# Patient Record
Sex: Female | Born: 1985
Health system: Southern US, Community
[De-identification: ages and names within clinical notes are randomized; demographics above are authoritative.]

## PROBLEM LIST (undated history)

## (undated) ENCOUNTER — Encounter

## (undated) ENCOUNTER — Encounter: Attending: Family | Primary: Family

## (undated) ENCOUNTER — Ambulatory Visit

## (undated) ENCOUNTER — Ambulatory Visit: Payer: PRIVATE HEALTH INSURANCE

## (undated) ENCOUNTER — Ambulatory Visit
Attending: Student in an Organized Health Care Education/Training Program | Primary: Student in an Organized Health Care Education/Training Program

## (undated) ENCOUNTER — Ambulatory Visit: Payer: PRIVATE HEALTH INSURANCE | Attending: Family | Primary: Family

## (undated) ENCOUNTER — Encounter
Attending: Student in an Organized Health Care Education/Training Program | Primary: Student in an Organized Health Care Education/Training Program

## (undated) ENCOUNTER — Ambulatory Visit: Payer: BLUE CROSS/BLUE SHIELD | Attending: Family | Primary: Family

## (undated) ENCOUNTER — Ambulatory Visit
Payer: BLUE CROSS/BLUE SHIELD | Attending: Student in an Organized Health Care Education/Training Program | Primary: Student in an Organized Health Care Education/Training Program

## (undated) ENCOUNTER — Ambulatory Visit: Payer: BLUE CROSS/BLUE SHIELD

## (undated) ENCOUNTER — Ambulatory Visit
Payer: PRIVATE HEALTH INSURANCE | Attending: Student in an Organized Health Care Education/Training Program | Primary: Student in an Organized Health Care Education/Training Program

## (undated) ENCOUNTER — Inpatient Hospital Stay: Payer: BLUE CROSS/BLUE SHIELD

## (undated) ENCOUNTER — Telehealth

## (undated) ENCOUNTER — Telehealth: Attending: Family | Primary: Family

## (undated) DIAGNOSIS — F988 Other specified behavioral and emotional disorders with onset usually occurring in childhood and adolescence: Secondary | ICD-10-CM

## (undated) DIAGNOSIS — E041 Nontoxic single thyroid nodule: Secondary | ICD-10-CM

## (undated) DIAGNOSIS — G5600 Carpal tunnel syndrome, unspecified upper limb: Secondary | ICD-10-CM

## (undated) DIAGNOSIS — Z9289 Personal history of other medical treatment: Secondary | ICD-10-CM

## (undated) HISTORY — PX: SINOSCOPY: SHX187

## (undated) HISTORY — PX: TONSILLECTOMY: SUR1361

## (undated) HISTORY — DX: Personal history of other medical treatment: Z92.89

## (undated) HISTORY — DX: Other specified behavioral and emotional disorders with onset usually occurring in childhood and adolescence: F98.8

## (undated) HISTORY — PX: DEEP NECK LYMPH NODE BIOPSY / EXCISION: SUR126

## (undated) HISTORY — DX: Carpal tunnel syndrome, unspecified upper limb: G56.00

## (undated) HISTORY — DX: Nontoxic single thyroid nodule: E04.1

## (undated) HISTORY — PX: ADENOIDECTOMY: SUR15

## (undated) HISTORY — PX: TONSILLECTOMY: SHX5217

---
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
## 2008-03-06 DIAGNOSIS — G5600 Carpal tunnel syndrome, unspecified upper limb: Secondary | ICD-10-CM

## 2008-03-06 HISTORY — DX: Carpal tunnel syndrome, unspecified upper limb: G56.00

## 2008-03-06 HISTORY — PX: CARPAL TUNNEL RELEASE: SHX101

## 2009-10-04 DIAGNOSIS — Z9289 Personal history of other medical treatment: Secondary | ICD-10-CM

## 2009-10-04 HISTORY — DX: Personal history of other medical treatment: Z92.89

## 2009-11-10 ENCOUNTER — Inpatient Hospital Stay (HOSPITAL_COMMUNITY)
Admission: AD | Admit: 2009-11-10 | Discharge: 2009-11-10 | Payer: Self-pay | Source: Home / Self Care | Admitting: Obstetrics and Gynecology

## 2009-11-10 ENCOUNTER — Ambulatory Visit: Payer: Self-pay | Admitting: Obstetrics and Gynecology

## 2009-11-10 DIAGNOSIS — O9989 Other specified diseases and conditions complicating pregnancy, childbirth and the puerperium: Secondary | ICD-10-CM

## 2009-11-10 DIAGNOSIS — O99891 Other specified diseases and conditions complicating pregnancy: Secondary | ICD-10-CM

## 2009-11-10 DIAGNOSIS — N949 Unspecified condition associated with female genital organs and menstrual cycle: Secondary | ICD-10-CM

## 2010-02-17 ENCOUNTER — Observation Stay (HOSPITAL_COMMUNITY)
Admission: AD | Admit: 2010-02-17 | Discharge: 2010-02-18 | Payer: Self-pay | Source: Home / Self Care | Attending: Obstetrics and Gynecology | Admitting: Obstetrics and Gynecology

## 2010-03-06 HISTORY — PX: TUBAL LIGATION: SHX77

## 2010-04-01 NOTE — Discharge Summary (Signed)
  NAMEMarland Kitchen  Regina Bradley, Regina Bradley              ACCOUNT NO.:  [id number]  MEDICAL RECORD NO.:  [id number]          PATIENT TYPE:  OBV  LOCATION:  9307                          FACILITY:  [facility]  PHYSICIAN:  Marlinda Mike, C.N.M.   DATE OF BIRTH:  [birth year]  DATE OF ADMISSION:  02/17/2010 DATE OF DISCHARGE:  02/18/2010                              DISCHARGE SUMMARY   DIAGNOSES:  Gestation  25 weeks, viral gastroenteritis, and dehydration.  DISCHARGE DIAGNOSIS:  Gestation 25 weeks, resolving viral gastroenteritis, and stable condition.  The patient is a 25 year old a obstetrical patient at 108 weeks' gestation with onset of nausea, vomiting, diarrhea over the course of 12 hours prior to admission.  The patient unable to tolerate p.o. intake. Urine on admission showed specific gravity of greater than 1.030 and 15 of ketones.  ALLERGIES:  The patient has a known allergy to CECLOR, ZITHROMAX, and CONTRAST DYE.  MEDICATIONS:  Current medications include a prenatal vitamin 1 tablet daily and Zofran 8 mg instant dissolve b.i.d. as needed for nausea and vomiting.  The patient is admitted for IV hydration and supportive management. Zofran 8 mg p.o. b.i.d. and 4 liters of IV fluid.  The patient is discharged home on day of discharge in stable condition.  Physical exam within normal limits.  Fetal heart tones 140s to 150s.  The patient is able to tolerate p.o. intake and the patient is afebrile at time of discharge.   DISCHARGE INSTRUCTIONS:  The patient is discharged to home in stable condition to advance diet as tolerated and to avoid dairy products x3-4 days.  Activity ad lib.  Return to [facility] for next return to [facility] visit as scheduled.  Medications at the time of discharge included prenatal vitamin 1 tablet daily and Zofran 8 mg p.o. b.i.d. as needed for nausea and vomiting.     Marlinda Mike, C.N.M.     TB/MEDQ  D:  03/17/2010  T:  03/18/2010  Job:  [id number]  Electronically  Signed by Marlinda Mike C.N.M. on 03/21/2010 10:33:20 PM Electronically Signed by Olivia Mackie M.D. on 04/01/2010 02:22:56 PM

## 2010-04-15 ENCOUNTER — Other Ambulatory Visit: Payer: Self-pay | Admitting: Obstetrics

## 2010-04-15 ENCOUNTER — Inpatient Hospital Stay (HOSPITAL_COMMUNITY)
Admission: AD | Admit: 2010-04-15 | Discharge: 2010-04-17 | DRG: 379 | Disposition: A | Discharge status: Home / Self Care | Payer: BC Managed Care – PPO | Source: Ambulatory Visit | Attending: Obstetrics | Admitting: Obstetrics

## 2010-04-15 DIAGNOSIS — O4700 False labor before 37 completed weeks of gestation, unspecified trimester: Principal | ICD-10-CM | POA: Diagnosis present

## 2010-04-16 ENCOUNTER — Other Ambulatory Visit: Payer: Self-pay | Admitting: Obstetrics

## 2010-04-16 ENCOUNTER — Inpatient Hospital Stay (HOSPITAL_COMMUNITY): Payer: BC Managed Care – PPO

## 2010-05-05 LAB — URINE CULTURE: Culture: NO GROWTH

## 2010-05-05 LAB — TYPE AND SCREEN
ABO/RH(D): B NEG
Antibody Screen: NEGATIVE

## 2010-05-05 LAB — WET PREP, GENITAL

## 2010-05-05 LAB — URINALYSIS, ROUTINE W REFLEX MICROSCOPIC
Bilirubin Urine: NEGATIVE
Nitrite: NEGATIVE
Specific Gravity, Urine: >1.03 — ABNORMAL HIGH (ref 1.005–1.030)
pH: 6 (ref 5.0–8.0)

## 2010-05-05 LAB — CBC
Hemoglobin: 11.4 g/dL — ABNORMAL LOW (ref 12.0–15.0)
Platelets: 182 10*3/uL (ref 150–400)
RBC: 3.7 MIL/uL — ABNORMAL LOW (ref 3.87–5.11)
WBC: 11 10*3/uL — ABNORMAL HIGH (ref 4.0–10.5)

## 2010-05-05 LAB — STREP B DNA PROBE

## 2010-05-05 LAB — FETAL FIBRONECTIN: Fetal Fibronectin: POSITIVE — AB

## 2010-05-09 ENCOUNTER — Inpatient Hospital Stay (HOSPITAL_COMMUNITY)
Admission: AD | Admit: 2010-05-09 | Discharge: 2010-05-09 | Disposition: A | Discharge status: Home / Self Care | Payer: BC Managed Care – PPO | Source: Ambulatory Visit | Attending: Obstetrics & Gynecology | Admitting: Obstetrics & Gynecology

## 2010-05-09 DIAGNOSIS — O4700 False labor before 37 completed weeks of gestation, unspecified trimester: Secondary | ICD-10-CM | POA: Insufficient documentation

## 2010-05-13 NOTE — Discharge Summary (Signed)
  Regina Bradley, Regina Bradley              ACCOUNT NO.:  [id number]  MEDICAL RECORD NO.:  [id number]           PATIENT TYPE:  I  LOCATION:  9154                          [facility]:  [facility]  PHYSICIAN:  Lendon Colonel, MD   DATE OF BIRTH:  1985-05-03  DATE OF ADMISSION:  04/15/2010 DATE OF DISCHARGE:  04/17/2010                              DISCHARGE SUMMARY   CHIEF COMPLAINT:  Contractions, question leaking of fluid.  HISTORY OF PRESENT ILLNESS:  This is a 25 year old G2, P1 at 33+ weeks who presented with complaints of occasional contractions and question leaking of fluid.  She had good fetal movement, no vaginal bleeding, and mild contractions.  The patient's pregnancy history is significant for early care at [facility] with good dating and multiple complaints of contractions, back pain, and joint pain throughout the pregnancy.  The remainder of past the medical, surgical, and obstetrical history is as per her admission H and P.  PHYSICAL EXAMINATION:  GENERAL:  On exam, she was afebrile with stable vitals. CARDIOVASCULAR:  Regular rate and rhythm. PULMONARY:  Normal. ABDOMEN:  Soft and nontender.  No right upper quadrant tenderness. GU:  She was fern, pool and  Nitrazine negative.  Her cervix was 1 cm, with a fetal vertex high in the vagina.  She had reactive fetal testing with a heart beat in 140s and was noted to be contracting q.3 minutes on the tocometer.  The patient had a fetal fibronectin that returned positive. Of note, she had intercourse about 24 hours prior.  HOSPITAL COURSE:  The patient was admitted with a positive fetal fibronectin, slight cervical change over a 2-hour period and contracting q.3 minutes.  She was given IV fluids.  She was started on penicillin for unknown group B strep status, and she was placed on labor unit where she received betamethasone and nifedipine for tocolysis.  By the a.m. of the next morning, the patient was still having contractions that had  slowed to q.10 minutes.  Repeat cervical exam was the same as her second exam in the ER which was 1 cm dilated, about 20% effaced with a soft cervix high, and fetal part high in the pelvis.  The patient was then transferred to the antepartum floor for 3 times a day monitoring. Penicillin was discontinued.  Strict I's and O's were discontinued, and the patient received her second betamethasone shot that happened 24 hours after her last and by hospital day #3, the patient was having only very rare contractions, still no cervical change, the remainder of her NST was reactive.  The patient was discharged to home with diagnosis of arrested preterm labor for bed rest at home and nifedipine tocolysis with a plan for repeat pelvic exam and fetal fibronectin in the office in 3-4 days and further management pending that test results.    Lendon Colonel, MD    KAF/MEDQ  D:  04/17/2010  T:  04/18/2010  Job:  [id number]  Electronically Signed by Noland Fordyce MD on 05/12/2010 07:09:15 PM

## 2010-05-16 LAB — URINALYSIS, ROUTINE W REFLEX MICROSCOPIC
Bilirubin Urine: NEGATIVE
Glucose, UA: NEGATIVE mg/dL
Ketones, ur: 15 mg/dL — AB
Specific Gravity, Urine: >1.03 — ABNORMAL HIGH (ref 1.005–1.030)
pH: 6 (ref 5.0–8.0)

## 2010-05-19 LAB — URINE CULTURE: Culture: NO GROWTH

## 2010-05-19 LAB — GC/CHLAMYDIA PROBE AMP, GENITAL: Chlamydia, DNA Probe: NEGATIVE

## 2010-05-19 LAB — URINALYSIS, ROUTINE W REFLEX MICROSCOPIC
Glucose, UA: NEGATIVE mg/dL
Hgb urine dipstick: NEGATIVE
Protein, ur: NEGATIVE mg/dL
Specific Gravity, Urine: 1.02 (ref 1.005–1.030)
Urobilinogen, UA: 0.2 mg/dL (ref 0.0–1.0)

## 2010-05-19 LAB — WET PREP, GENITAL
Clue Cells Wet Prep HPF POC: NONE SEEN
Trich, Wet Prep: NONE SEEN
Yeast Wet Prep HPF POC: NONE SEEN

## 2010-05-22 ENCOUNTER — Inpatient Hospital Stay (HOSPITAL_COMMUNITY)
Admission: AD | Admit: 2010-05-22 | Discharge: 2010-05-25 | DRG: 371 | Disposition: A | Discharge status: Home / Self Care | Payer: BC Managed Care – PPO | Source: Ambulatory Visit | Attending: Obstetrics and Gynecology | Admitting: Obstetrics and Gynecology

## 2010-05-22 ENCOUNTER — Other Ambulatory Visit: Payer: Self-pay | Admitting: Obstetrics & Gynecology

## 2010-05-22 DIAGNOSIS — O3660X0 Maternal care for excessive fetal growth, unspecified trimester, not applicable or unspecified: Principal | ICD-10-CM | POA: Diagnosis present

## 2010-05-22 DIAGNOSIS — O409XX0 Polyhydramnios, unspecified trimester, not applicable or unspecified: Secondary | ICD-10-CM | POA: Diagnosis present

## 2010-05-22 DIAGNOSIS — Z302 Encounter for sterilization: Secondary | ICD-10-CM

## 2010-05-22 LAB — RPR: RPR Ser Ql: NONREACTIVE

## 2010-05-22 LAB — CBC
HCT: 37.9 % (ref 36.0–46.0)
MCHC: 34 g/dL (ref 30.0–36.0)
Platelets: 170 10*3/uL (ref 150–400)
RDW: 13.5 % (ref 11.5–15.5)
WBC: 9.2 10*3/uL (ref 4.0–10.5)

## 2010-05-23 LAB — CBC
MCV: 90.7 fL (ref 78.0–100.0)
Platelets: 180 10*3/uL (ref 150–400)
RBC: 3.88 MIL/uL (ref 3.87–5.11)
RDW: 13.6 % (ref 11.5–15.5)
WBC: 11.1 10*3/uL — ABNORMAL HIGH (ref 4.0–10.5)

## 2010-05-25 ENCOUNTER — Encounter (HOSPITAL_COMMUNITY)
Admission: RE | Admit: 2010-05-25 | Discharge: 2010-05-25 | Disposition: A | Discharge status: Home / Self Care | Payer: BC Managed Care – PPO | Source: Ambulatory Visit | Attending: Obstetrics & Gynecology | Admitting: Obstetrics & Gynecology

## 2010-05-25 DIAGNOSIS — O923 Agalactia: Secondary | ICD-10-CM | POA: Insufficient documentation

## 2010-05-26 NOTE — Op Note (Signed)
Regina Bradley, Regina Bradley              ACCOUNT NO.:  [id number]  MEDICAL RECORD NO.:  [id number]           PATIENT TYPE:  I  LOCATION:  9103                          [facility]ILITY:  [facility]  PHYSICIAN:  Darryl Nestle, MD     DATE OF BIRTH:  [birth year]  DATE OF PROCEDURE:  05/22/2010 DATE OF DISCHARGE:                              OPERATIVE REPORT   PREOPERATIVE DIAGNOSES:  38 weeks and 3 days active labor, suspected macrosomia.  The patient desired elective cesarean section, polyhydramnios.  Permanent sterilization desired.  POSTOPERATIVE DIAGNOSES:  38 weeks and 3 days active labor, suspected macrosomia.  The patient desired elective cesarean section, polyhydramnios.  PROCEDURE:  Primary low transverse cesarean section and permanent sterilization with tubal ligation.  SURGEON:  Darryl Nestle, MD  ASSISTANT:  Lenoard Aden, MD  ANESTHESIA:  Spinal.  FINDINGS:  Female infant, floating head, large amount of amniotic fluid. Baby delivery with vacuum one pull at 11:55 a.m. Apgars 9 and 9 at 1 and 5 minutes and weight 9 pounds 4 ounces.  Placenta normal, three-vessel cord.  Bilateral tubes and ovaries normal.  SPECIMEN:  Bilateral cut segments of fallopian tubes sent to Pathology.  ESTIMATED BLOOD LOSS:  800 mL.  IV FLUIDS:  3200 mL LR.  URINE OUTPUT:  200 mL clear.  COMPLICATIONS:  None.  CONDITION:  Stable.  DISPOSITION:  To PACU.  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  The patient was scheduled for induction at 39 weeks for these reasons; however, she called back the office and requested a primary cesarean delivery instead of induction of labor.  Risks and complications of surgery including the complications at surgery of infection, bleeding, damage to  internal organs as well as VTE, delayed complications including long term problems having had a surgery were discussed with the patient, and she was voiced understanding but still desired to proceed with primary elective cesarean delivery.  She also wanted permanent sterilization, CREST data was reviewed with her with risk of regret and also discussed risk of failure with the risk of ectopic pregnancy, and the patient voiced understanding.  The patient presented today in active labor.  She was 3-4 cm dilated with bulging bag of water and the fetal head was at the pelvic brim but ballotable, decision was made to proceed with C- section today.  Risks and complications were reviewed, informed written consent was obtained.  The patient was brought to the operating room with IV running.  She received gentamicin and clindamycin, appropriate perioperative antibiotic coverage due to CECLOR allergy.  The spinal anesthesia was noted to be adequate.  She was in dorsal supine position. Foley catheter was placed.  Parts were prepped and draped in a normal sterile fashion.  Anesthesia was adequate.  Pfannenstiel incision was made with scalpel and carried down to the underlying fascia which was incised in the midline and extended laterally curving upward with scissors and Bovie.  The underlying muscles were dissected off by blunt and sharp dissection and muscles were separated followed by grasping of the peritoneum, sharp entry was made away from the bladder and incision was extended superiorly and inferiorly.  Bladder blade was placed, lower segment of the uterus was distended.  There was vascularity on the lower segment which were clamped and cauterized.  The baby was noted to be floating in the lower segment.  A bladder flap was created and bladder blade was advanced.  A low transverse hysterotomy was performed with scalpel and carried down and the incision was extended laterally curving upward with  bandage scissors.  At this point, bulging amniotic membranes were ruptured with Allis clamps and large amount of amniotic fluid was drained.  Fundal pressure was maintained to keep the head from moving out of the lower segment, however, it stayed floating and was brought to the incision, but the delivery was difficult due to deflected head and was completed with vacuum pull at the incision with one gentle traction. The shoulders were delivered followed by rest of the baby.  Cord was clamped and cut and baby was given to the awaiting neonatologist.  The bleeders in the lower segment were clamped with the ring forceps. Placenta was then removed by gentle massage and gentle traction and placenta was noted to be complete and intact and three vessels were noted in the cord.  Placenta was sent to Labor and Delivery.  The uterus was cleaned from the inside with a clean lap.  Hemostasis was excellent with the help of ring forceps grasping the edges of the hysterotomy. Uterine tone was improved with Pitocin and hysterotomy was closed in single layer, continuous interlocking of Monocryl.  Bilateral tubes were identified, ovaries appeared normal.  Ampullary portion of tube was grasped with Tanja Port and a modified Pomeroy tubal ligation was done and the cut segment of the tube was sent to pathology from both the sites. Hemostasis and the tubal stumps was excellent and tubes were returned to the abdomen.  The hysterotomy was inspected again, blood clots that were collected were removed.  Hemostasis was excellent.  The parietal peritoneum was now closed using 2-0 Vicryl.  Fascia was closed using 0 Vicryl starting from 2 angles meeting in midline.  The subcutaneous tissue was approximated using 2-0 plain gut and skin was approximated using staples.  Sterile dressing was given.  The patient was stable and brought to the recovery room.  Please note the patient also had SCDs placed during surgery.  No  complications.  All counts were correct x2.     Darryl Nestle, MD     VM/MEDQ  D:  05/22/2010  T:  05/23/2010  Job:  [id number]  Electronically Signed by Susa Day Shelah Heatley  on 05/26/2010 07:48:09 AM

## 2010-05-31 ENCOUNTER — Inpatient Hospital Stay (HOSPITAL_COMMUNITY)
Admission: AD | Admit: 2010-05-31 | Discharge: 2010-05-31 | Disposition: A | Discharge status: Home / Self Care | Payer: BC Managed Care – PPO | Source: Ambulatory Visit | Attending: Obstetrics | Admitting: Obstetrics

## 2010-05-31 DIAGNOSIS — O909 Complication of the puerperium, unspecified: Secondary | ICD-10-CM | POA: Insufficient documentation

## 2010-05-31 DIAGNOSIS — L02219 Cutaneous abscess of trunk, unspecified: Secondary | ICD-10-CM | POA: Insufficient documentation

## 2010-05-31 DIAGNOSIS — L03319 Cellulitis of trunk, unspecified: Secondary | ICD-10-CM | POA: Insufficient documentation

## 2010-06-25 ENCOUNTER — Encounter (HOSPITAL_COMMUNITY)
Admission: RE | Admit: 2010-06-25 | Discharge: 2010-06-25 | Disposition: A | Discharge status: Home / Self Care | Payer: BC Managed Care – PPO | Source: Ambulatory Visit | Attending: Obstetrics & Gynecology | Admitting: Obstetrics & Gynecology

## 2010-06-25 DIAGNOSIS — O923 Agalactia: Secondary | ICD-10-CM | POA: Insufficient documentation

## 2010-07-05 NOTE — Discharge Summary (Signed)
  Regina Bradley, Regina Bradley              ACCOUNT NO.:  [id number]  MEDICAL RECORD NO.:  [id number]           PATIENT TYPE:  I  LOCATION:  9103                          [facility]:  [facility]  PHYSICIAN:  Darryl Nestle, MD     DATE OF BIRTH:  1986-01-02  DATE OF ADMISSION:  05/22/2010 DATE OF DISCHARGE:  05/25/2010                              DISCHARGE SUMMARY   DIAGNOSES:  25 weeks' gestation, onset of labor large for gestational age, suspect macrosomia, desires elective cesarean section.  DISCHARGE DIAGNOSIS:  Postoperative day #3 status cesarean section and bilateral tubal sterilization.  PATIENT HISTORY:  The patient is a 25 year old gravida 2, para 1 at 38 weeks and 5 days with an [facility] of March 27.  Prenatal care at [facility] since 9 weeks' gestation with Dr. Juliene Pina as primary.  PRENATAL LABS:  Type and Rh B positive, rubella positive, HIV negative, RPR negative, Hepatitis B negative, GTT within normal limits, and a beta strep positive.  PRENATAL COURSE:  Complicated by history of large for gestational age, previous spontaneous vaginal delivery of 9 pounds 1 ounce with third- degree laceration.  MEDICAL AND SURGICAL HISTORY:  Significant for Sullivan Lone syndrome, tonsillectomy and carpal tunnel release on the right wrist.  ALLERGIES:  ZITHROMAX, PHENERGAN, and CECLOR.  CURRENT MEDICATION:  Prenatal vitamin 1 tablet p.o. daily.  PRESENTATION:  The patient presents with the onset of labor and latent labor 3-4 cm dilated, reactive NST.  The patient has been counseled related to large for gestational age with estimated fetal weight greater than 10 pounds per ultrasound.  The patient elects for cesarean section and refuses labor.  Admission CBC; white blood cell count 9.2, hemoglobin 12.9, hematocrit 36.9, and platelet count of 170.  PROCEDURE:  The patient underwent cesarean section and bilateral tubal sterilization by Dr. Juliene Pina on March 18 with delivery of a female, 9 pounds 4 ounces  with Apgar scores of 9 and 9, newborn was transferred to regular nursery.  POSTOPERATIVE COURSE:  The patient's postoperative postpartum course was uneventful.  White blood cell count of 11.9, hemoglobin 11.7, hematocrit 35.2, and platelet count of 180.  Vital signs were stable.  The patient remained afebrile during hospitalization.  Physical exam was within normal limits.  Wound edges are well approximated with staples.  No erythema, no ecchymosis, no drainage.  The patient is discharged home on postop day #3.  DISCHARGE INSTRUCTIONS:  Postpartum care per [facility] book.  Regular diet. Activity:  Postoperative restrictions x2 weeks.  Incision:  Staple removal on postpartum day 4 to 5 secondary to obesity.  MEDICATIONS: 1. Prenatal vitamin 1 tablet p.o. daily. 2. Ibuprofen 800 mg every 8 hours as needed for discomfort p.r.n. 3. Percocet 1-2 tablets every 4 hours as needed for pain p.r.n.     Marlinda Mike, C.N.M.   ______________________________ Darryl Nestle, MD    TB/MEDQ  D:  05/25/2010  T:  05/26/2010  Job:  [id number]  Electronically Signed by Marlinda Mike C.N.M. on 05/26/2010 04:26:29 PM Electronically Signed by Susa Day Ayodele Hartsock  on 07/05/2010 03:18:30 PM

## 2011-03-20 ENCOUNTER — Ambulatory Visit
Admission: RE | Admit: 2011-03-20 | Discharge: 2011-03-20 | Disposition: A | Discharge status: Home / Self Care | Payer: BC Managed Care – PPO | Source: Ambulatory Visit | Attending: Obstetrics & Gynecology | Admitting: Obstetrics & Gynecology

## 2011-03-20 ENCOUNTER — Other Ambulatory Visit: Payer: Self-pay | Admitting: Obstetrics & Gynecology

## 2011-03-20 DIAGNOSIS — N6320 Unspecified lump in the left breast, unspecified quadrant: Secondary | ICD-10-CM

## 2011-03-21 ENCOUNTER — Other Ambulatory Visit: Payer: BC Managed Care – PPO

## 2011-03-27 ENCOUNTER — Other Ambulatory Visit: Payer: BC Managed Care – PPO

## 2012-01-16 ENCOUNTER — Other Ambulatory Visit: Payer: Self-pay | Admitting: Obstetrics & Gynecology

## 2012-01-16 DIAGNOSIS — E049 Nontoxic goiter, unspecified: Secondary | ICD-10-CM

## 2012-01-22 ENCOUNTER — Ambulatory Visit
Admission: RE | Admit: 2012-01-22 | Discharge: 2012-01-22 | Disposition: A | Discharge status: Home / Self Care | Payer: BC Managed Care – PPO | Source: Ambulatory Visit | Attending: Obstetrics & Gynecology | Admitting: Obstetrics & Gynecology

## 2012-01-22 DIAGNOSIS — E049 Nontoxic goiter, unspecified: Secondary | ICD-10-CM

## 2012-02-15 ENCOUNTER — Other Ambulatory Visit: Payer: Self-pay | Admitting: Gastroenterology

## 2012-02-19 ENCOUNTER — Ambulatory Visit
Admission: RE | Admit: 2012-02-19 | Discharge: 2012-02-19 | Disposition: A | Discharge status: Home / Self Care | Payer: BC Managed Care – PPO | Source: Ambulatory Visit | Attending: Gastroenterology | Admitting: Gastroenterology

## 2012-10-22 ENCOUNTER — Other Ambulatory Visit: Payer: Self-pay | Admitting: Gastroenterology

## 2012-10-22 DIAGNOSIS — R1011 Right upper quadrant pain: Secondary | ICD-10-CM

## 2012-10-22 DIAGNOSIS — R110 Nausea: Secondary | ICD-10-CM

## 2012-11-11 ENCOUNTER — Encounter (HOSPITAL_COMMUNITY): Payer: BC Managed Care – PPO

## 2012-11-11 ENCOUNTER — Ambulatory Visit (HOSPITAL_COMMUNITY): Payer: BC Managed Care – PPO

## 2012-11-25 ENCOUNTER — Encounter (HOSPITAL_COMMUNITY): Admission: RE | Admit: 2012-11-25 | Payer: BC Managed Care – PPO | Source: Ambulatory Visit

## 2012-11-25 ENCOUNTER — Ambulatory Visit (HOSPITAL_COMMUNITY)
Admission: RE | Admit: 2012-11-25 | Discharge: 2012-11-25 | Disposition: A | Discharge status: Home / Self Care | Payer: BC Managed Care – PPO | Source: Ambulatory Visit | Attending: Gastroenterology | Admitting: Gastroenterology

## 2012-11-25 DIAGNOSIS — D1803 Hemangioma of intra-abdominal structures: Secondary | ICD-10-CM | POA: Insufficient documentation

## 2012-11-25 DIAGNOSIS — R110 Nausea: Secondary | ICD-10-CM | POA: Insufficient documentation

## 2012-11-25 DIAGNOSIS — R1011 Right upper quadrant pain: Secondary | ICD-10-CM | POA: Insufficient documentation

## 2013-04-10 ENCOUNTER — Encounter: Payer: Self-pay | Admitting: Cardiology

## 2013-04-10 DIAGNOSIS — Z331 Pregnant state, incidental: Secondary | ICD-10-CM

## 2013-04-10 DIAGNOSIS — R002 Palpitations: Secondary | ICD-10-CM

## 2013-07-22 IMAGING — US US ABDOMEN COMPLETE
1 series · 14 of 25 positions shown · non-contrast
Comparison: None, the images from the ultrasound of the abdomen of
07/05/2009 were not available for comparison currently

CLINICAL DATA: Elevated liver function tests

COMPLETE ABDOMINAL ULTRASOUND

[Series 1: us abdomen complete · 0.24mm/px · 14 of 92 slices shown]
[im 1/92]
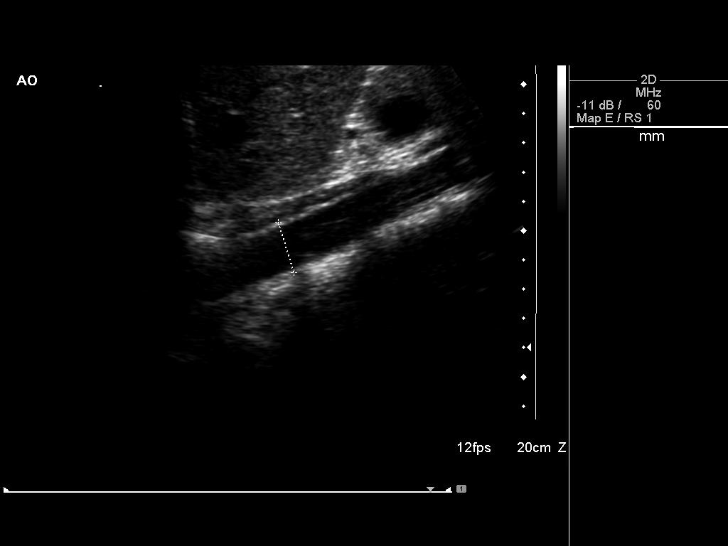
[im 8/92]
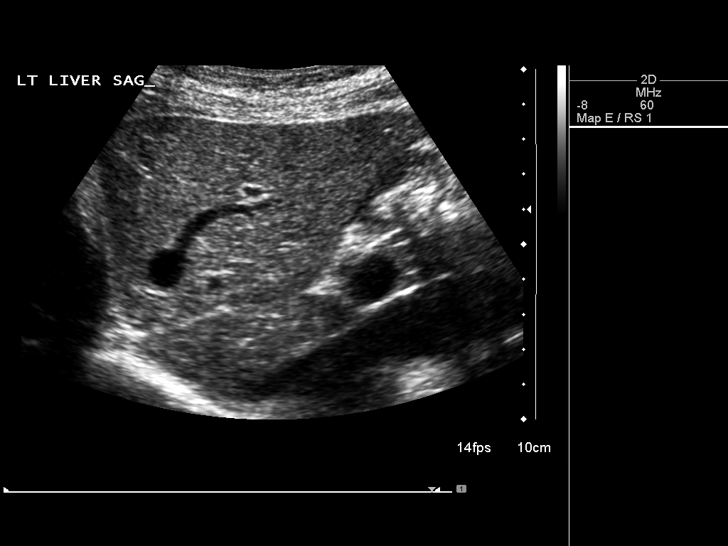
[im 16/92]
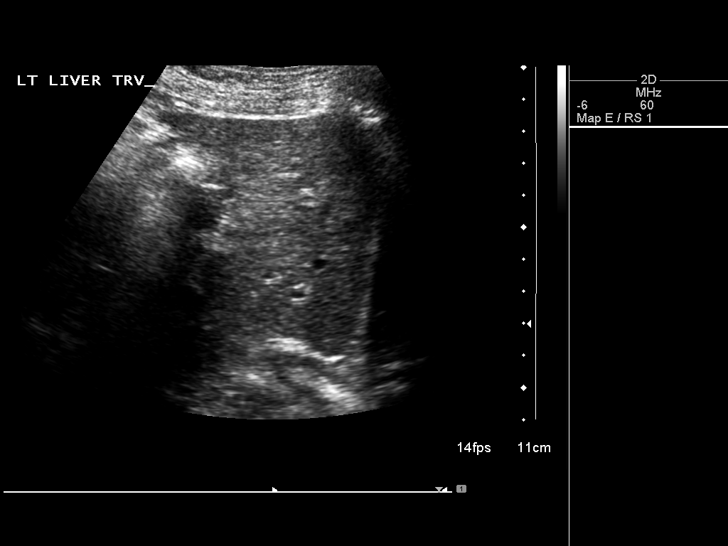
[im 23/92]
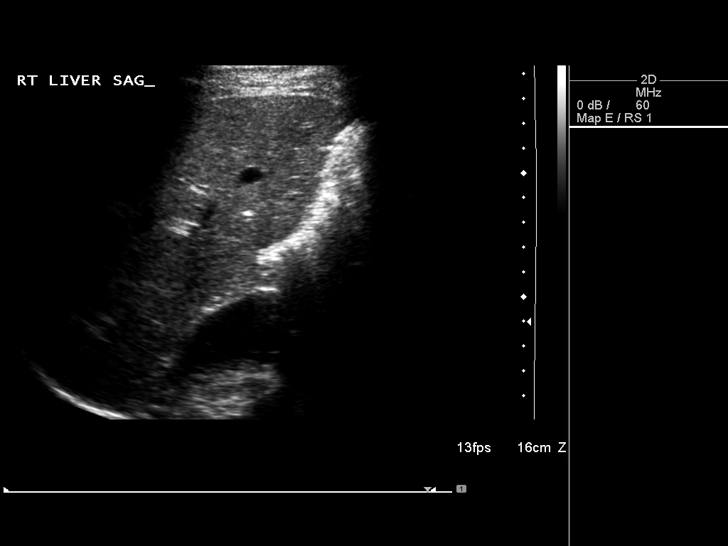
[im 31/92]
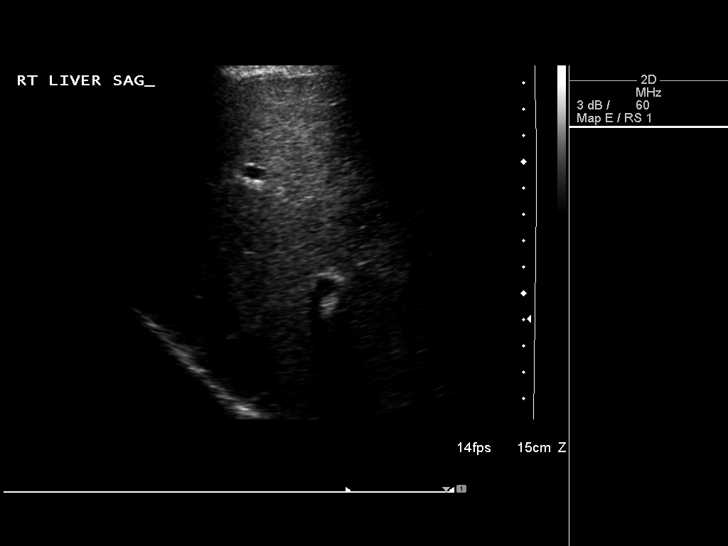
[im 35/92]
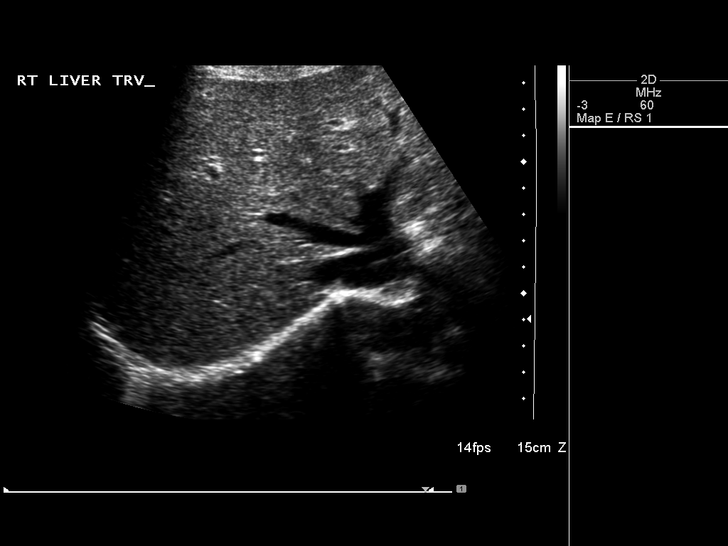
[im 42/92]
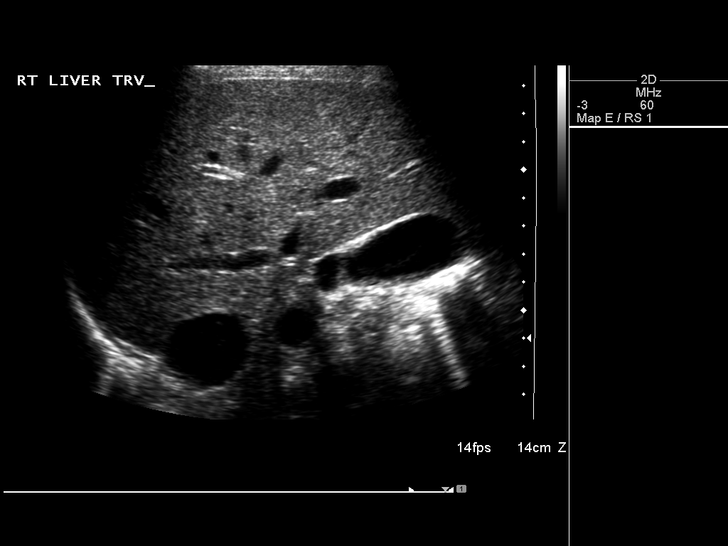
[im 50/92]
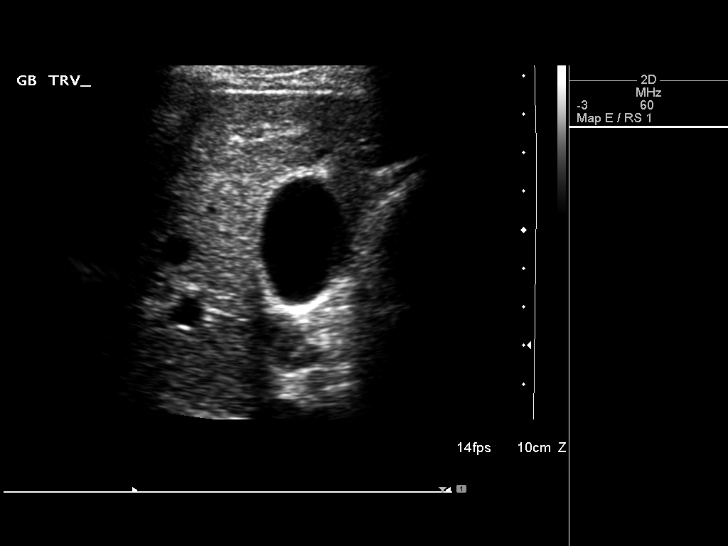
[im 57/92]
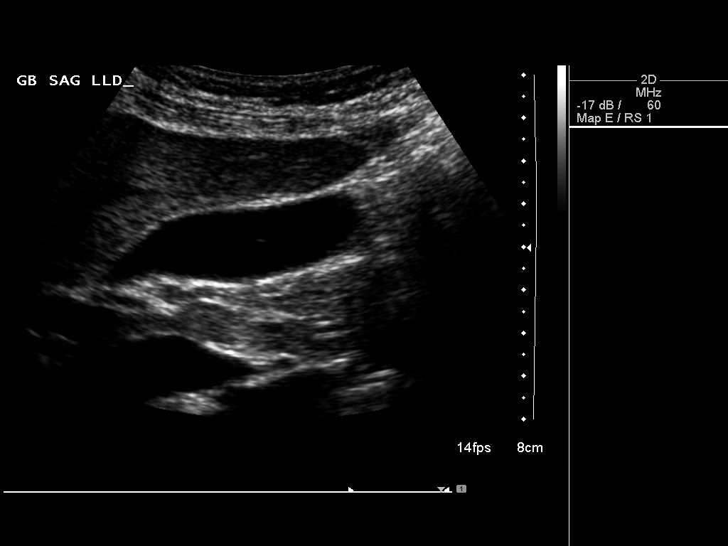
[im 61/92]
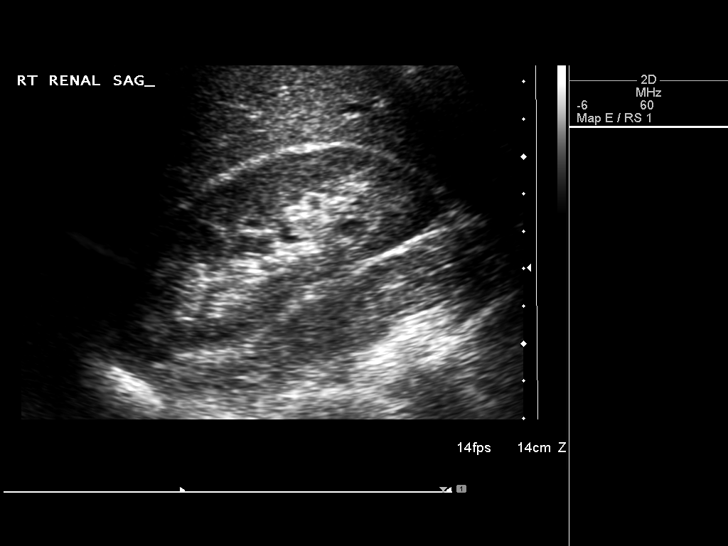
[im 69/92]
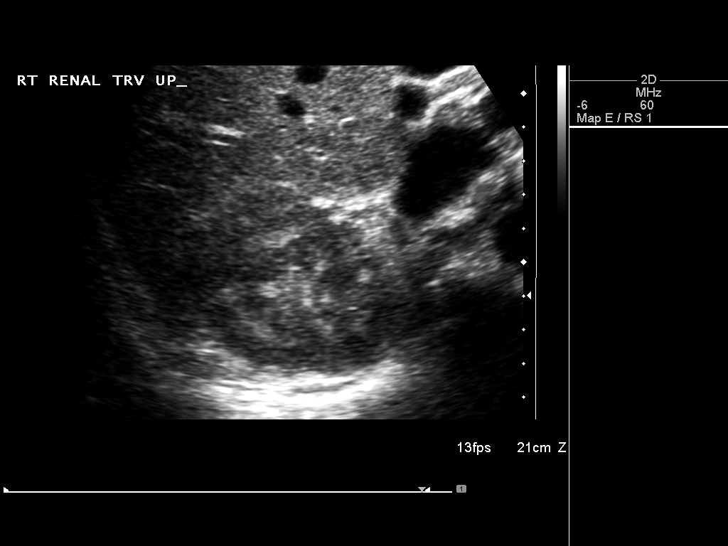
[im 76/92]
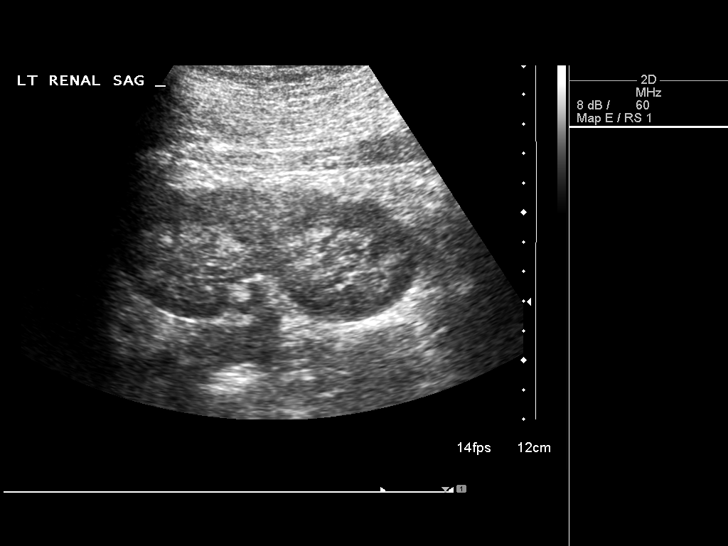
[im 84/92]
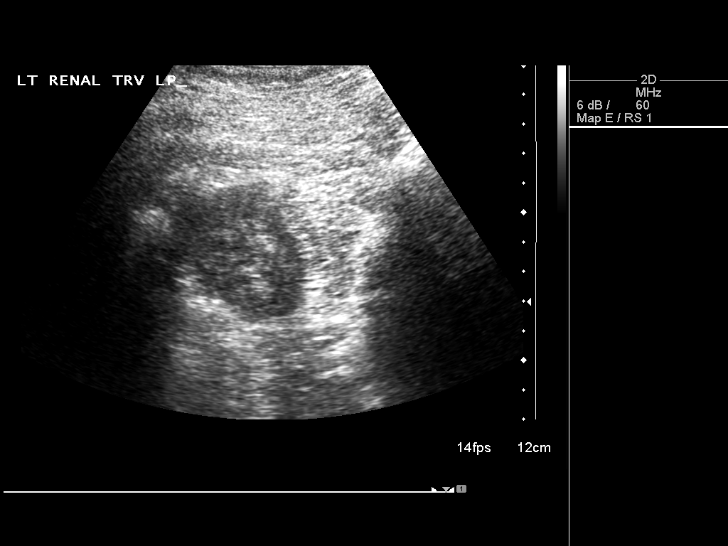
[im 92/92]
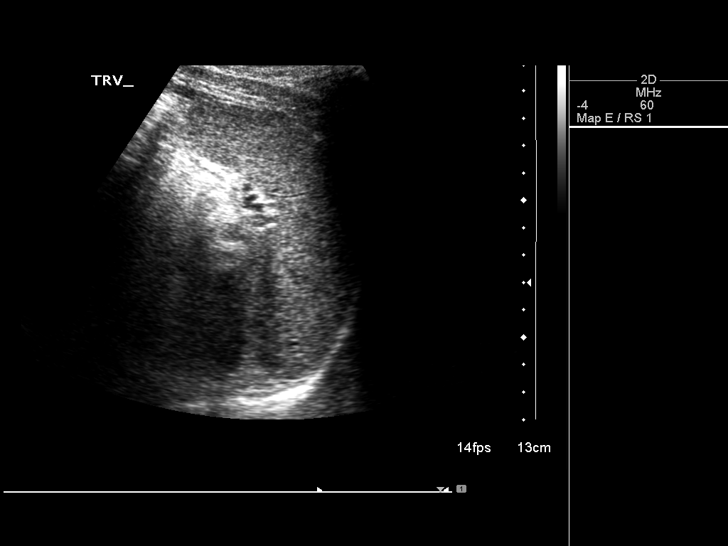

[14 of 25 positions shown; findings below may reference images not displayed]

FINDINGS: Gallbladder:  The gallbladder is visualized and no gallstones are
noted.  There is no pain over the gallbladder with compression.

Common bile duct:  The common bile duct is normal measuring 3.3 mm
in diameter.

Liver:  The liver has a normal echogenic pattern with the exception
of a single echogenic focus in the left lobe of 2.4 x 2.5 x 2.2 cm.

IVC:  Appears normal.

Pancreas:  The pancreas is largely obscured by bowel gas.

Spleen:  The spleen is normal measuring 6.4 cm sagittally.

Right Kidney:  No hydronephrosis is seen.  The right kidney
measures 10.8 cm sagittally.

Left Kidney:  No hydronephrosis is noted.  The left kidney measures
10.4 cm.

Abdominal aorta:  The abdominal aorta is normal in caliber.
IMPRESSION: 1.  No gallstones.  No ductal dilatation.
2.  Liver hemangioma of 2.5 cm in maximum diameter.
3.  The pancreas is obscured by bowel gas.

## 2014-04-28 IMAGING — US US ABDOMEN COMPLETE
1 series · 13 of 25 positions shown · non-contrast
Comparison: 02/19/2012

CLINICAL DATA: Right upper quadrant abdominal pain and nausea.

ABDOMINAL ULTRASOUND COMPLETE

[Series 1: us abdomen complete · 0.24mm/px · 13 of 76 slices shown]
[im 1/76]
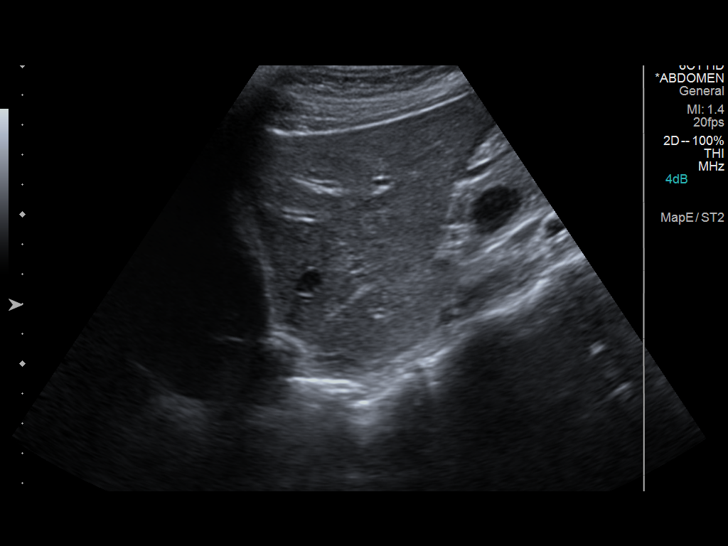
[im 7/76]
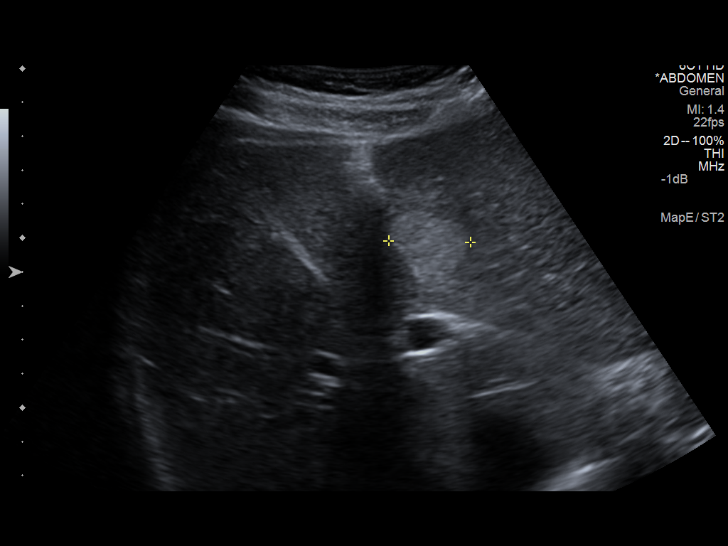
[im 13/76]
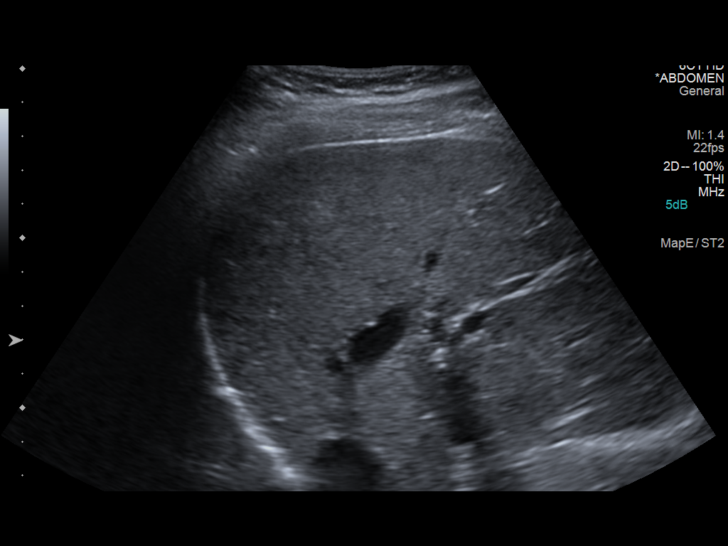
[im 19/76]
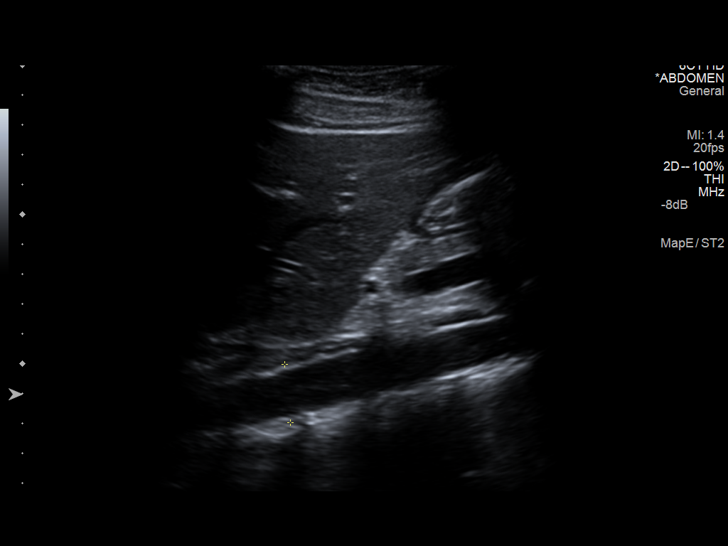
[im 26/76]
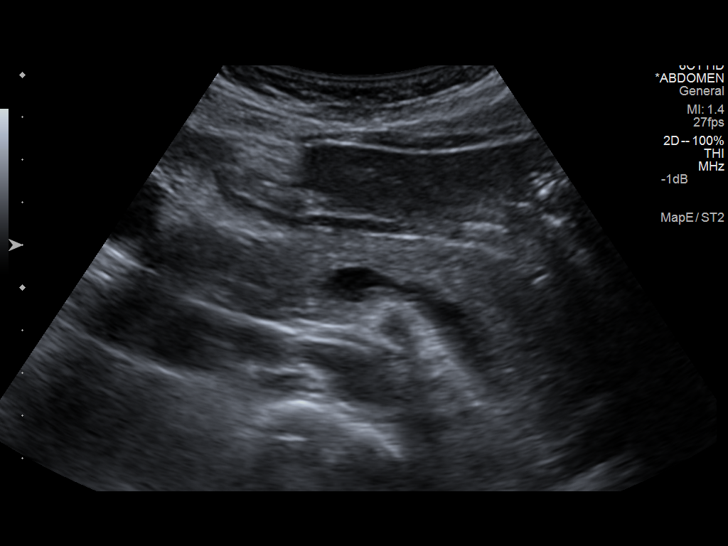
[im 32/76]
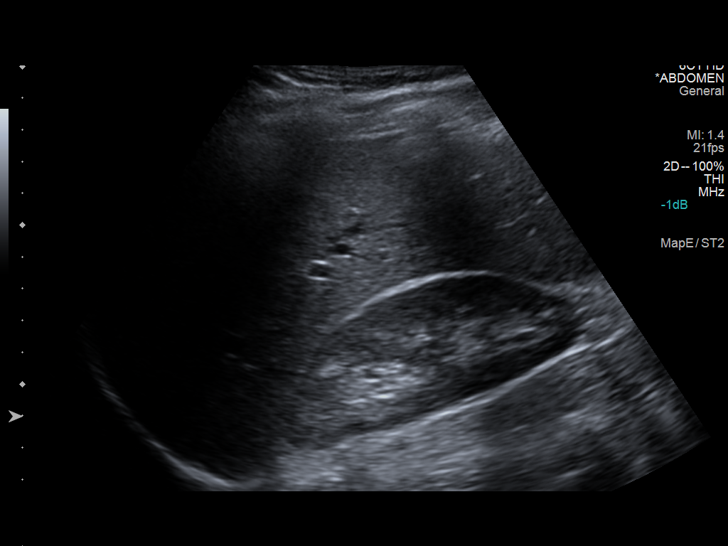
[im 38/76]
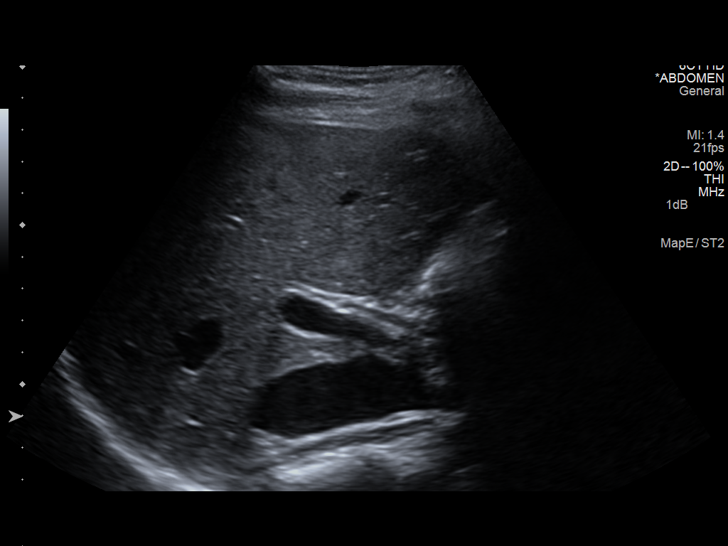
[im 44/76]
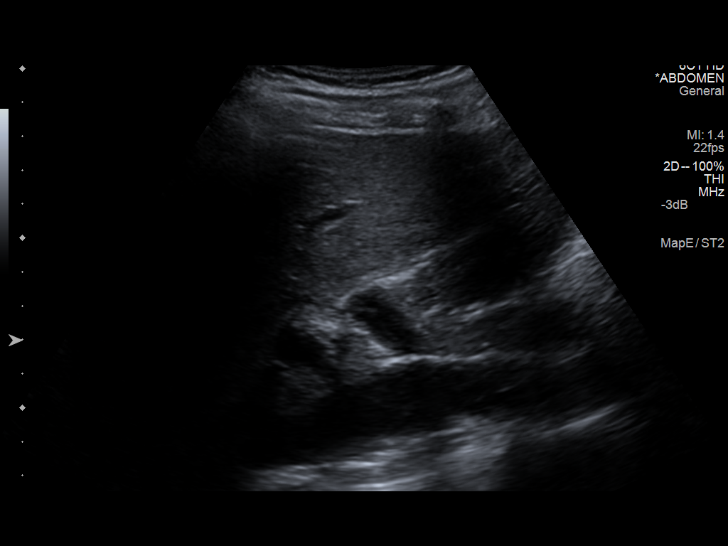
[im 51/76]
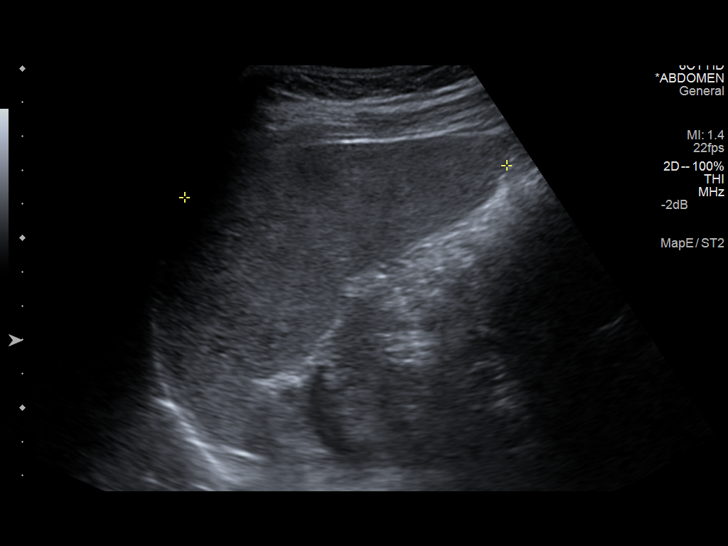
[im 57/76]
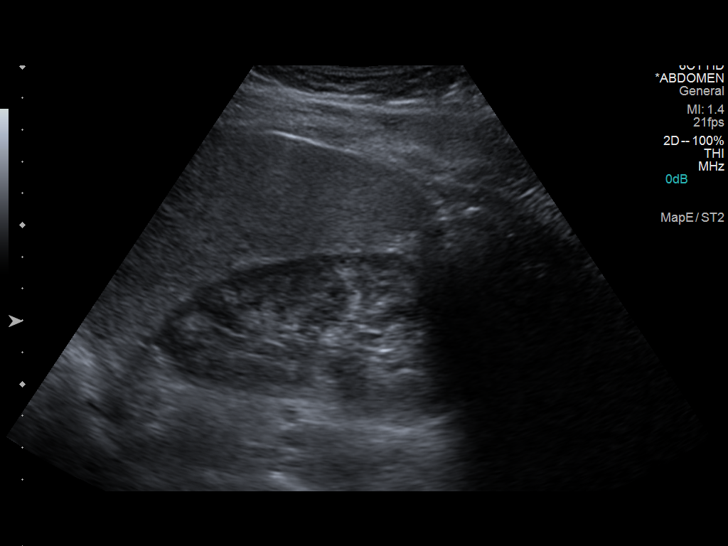
[im 63/76]
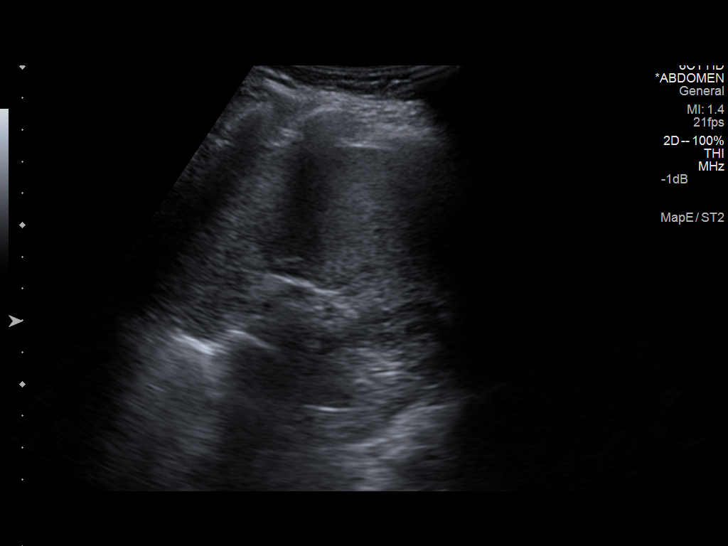
[im 69/76]
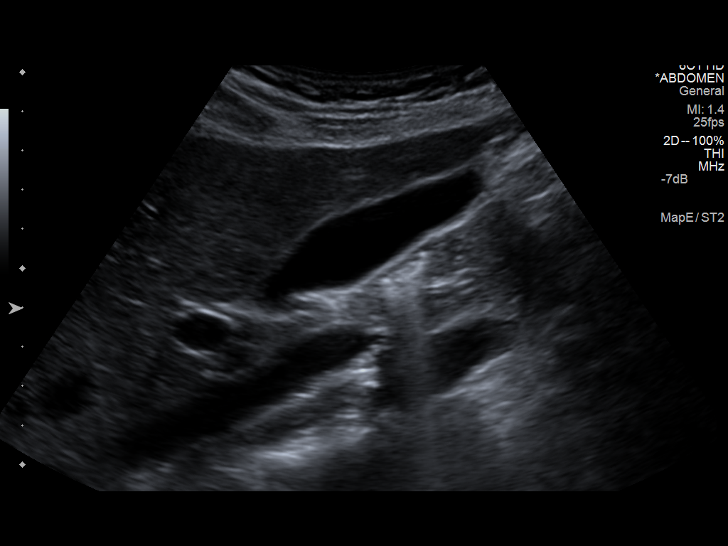
[im 76/76]
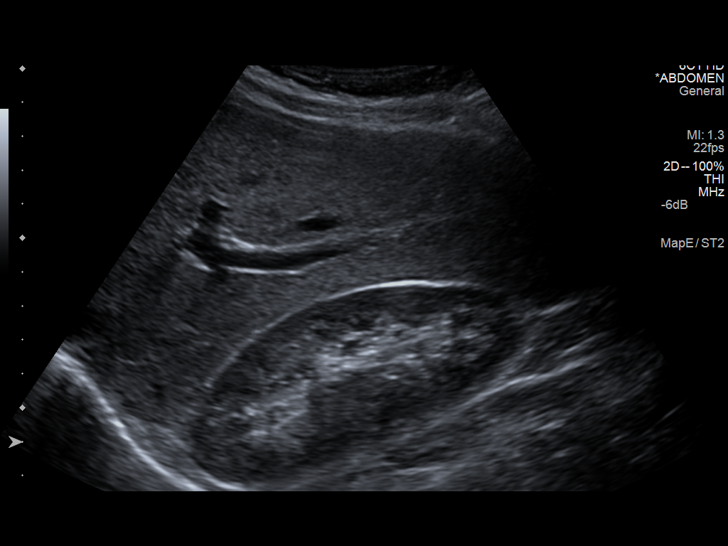

[13 of 25 positions shown; findings below may reference images not displayed]

FINDINGS: Gallbladder:  No gallstones, gallbladder wall thickening, or
pericholecystic fluid.

Common Bile Duct:  Within normal limits in caliber.

Liver: Round echogenic left hepatic lobe mass is re-identified
measuring 2.4 x 2.4 x 2.2 cm, not significantly changed allowing
for differences in technique.  This is compatible with a previously
identified hemangioma.

IVC:  Appears normal.

Pancreas:  No abnormality identified.

Spleen:  Within normal limits in size and echotexture.

Right kidney:  Normal in size and parenchymal echogenicity.  No
evidence of mass or hydronephrosis.

Left kidney:  Normal in size and parenchymal echogenicity.  No
evidence of mass or hydronephrosis.

At real time imaging, the sonographer questioned whether the
pyramids appear echogenic bilaterally which can be a sign of
medullary sponge kidney or other metabolic abnormalities, but this
is felt to most likely be technical in the absence of any known
abnormalities of renal function tests.

Abdominal Aorta:  No aneurysm identified.
IMPRESSION: Left hepatic lobe hemangioma re-identified. No acute abnormality.

## 2018-04-05 DIAGNOSIS — R112 Nausea with vomiting, unspecified: Secondary | ICD-10-CM | POA: Diagnosis not present

## 2018-04-05 DIAGNOSIS — J019 Acute sinusitis, unspecified: Secondary | ICD-10-CM | POA: Diagnosis not present

## 2018-04-23 DIAGNOSIS — N6320 Unspecified lump in the left breast, unspecified quadrant: Secondary | ICD-10-CM | POA: Diagnosis not present

## 2018-04-23 DIAGNOSIS — Z8639 Personal history of other endocrine, nutritional and metabolic disease: Secondary | ICD-10-CM | POA: Diagnosis not present

## 2018-04-23 DIAGNOSIS — R61 Generalized hyperhidrosis: Secondary | ICD-10-CM | POA: Diagnosis not present

## 2018-04-23 DIAGNOSIS — N926 Irregular menstruation, unspecified: Secondary | ICD-10-CM | POA: Diagnosis not present

## 2018-04-23 DIAGNOSIS — R140 Abdominal distension (gaseous): Secondary | ICD-10-CM | POA: Diagnosis not present

## 2018-04-24 DIAGNOSIS — N6312 Unspecified lump in the right breast, upper inner quadrant: Secondary | ICD-10-CM | POA: Diagnosis not present

## 2018-04-24 DIAGNOSIS — N6321 Unspecified lump in the left breast, upper outer quadrant: Secondary | ICD-10-CM | POA: Diagnosis not present

## 2018-04-24 DIAGNOSIS — R922 Inconclusive mammogram: Secondary | ICD-10-CM | POA: Diagnosis not present

## 2018-04-24 DIAGNOSIS — N6323 Unspecified lump in the left breast, lower outer quadrant: Secondary | ICD-10-CM | POA: Diagnosis not present

## 2018-05-07 DIAGNOSIS — K118 Other diseases of salivary glands: Secondary | ICD-10-CM | POA: Diagnosis not present

## 2018-05-07 DIAGNOSIS — Z7289 Other problems related to lifestyle: Secondary | ICD-10-CM | POA: Diagnosis not present

## 2018-05-07 DIAGNOSIS — J329 Chronic sinusitis, unspecified: Secondary | ICD-10-CM | POA: Diagnosis not present

## 2018-05-21 DIAGNOSIS — R140 Abdominal distension (gaseous): Secondary | ICD-10-CM | POA: Diagnosis not present

## 2018-05-21 DIAGNOSIS — J324 Chronic pansinusitis: Secondary | ICD-10-CM | POA: Diagnosis not present

## 2018-05-21 DIAGNOSIS — J329 Chronic sinusitis, unspecified: Secondary | ICD-10-CM | POA: Diagnosis not present

## 2018-05-21 DIAGNOSIS — K118 Other diseases of salivary glands: Secondary | ICD-10-CM | POA: Diagnosis not present

## 2018-05-21 DIAGNOSIS — N926 Irregular menstruation, unspecified: Secondary | ICD-10-CM | POA: Diagnosis not present

## 2018-05-21 DIAGNOSIS — E282 Polycystic ovarian syndrome: Secondary | ICD-10-CM | POA: Diagnosis not present

## 2018-05-21 DIAGNOSIS — Z8742 Personal history of other diseases of the female genital tract: Secondary | ICD-10-CM | POA: Diagnosis not present

## 2018-06-06 DIAGNOSIS — J324 Chronic pansinusitis: Secondary | ICD-10-CM | POA: Diagnosis not present

## 2018-06-06 DIAGNOSIS — J301 Allergic rhinitis due to pollen: Secondary | ICD-10-CM | POA: Diagnosis not present

## 2018-06-06 DIAGNOSIS — K118 Other diseases of salivary glands: Secondary | ICD-10-CM | POA: Diagnosis not present

## 2018-06-10 DIAGNOSIS — R9389 Abnormal findings on diagnostic imaging of other specified body structures: Secondary | ICD-10-CM | POA: Diagnosis not present

## 2018-06-10 DIAGNOSIS — K118 Other diseases of salivary glands: Secondary | ICD-10-CM | POA: Diagnosis not present

## 2018-06-10 DIAGNOSIS — D110 Benign neoplasm of parotid gland: Secondary | ICD-10-CM | POA: Diagnosis not present

## 2018-06-18 DIAGNOSIS — K118 Other diseases of salivary glands: Secondary | ICD-10-CM | POA: Diagnosis not present

## 2018-08-05 DIAGNOSIS — E041 Nontoxic single thyroid nodule: Secondary | ICD-10-CM | POA: Diagnosis not present

## 2018-08-07 DIAGNOSIS — E041 Nontoxic single thyroid nodule: Secondary | ICD-10-CM | POA: Diagnosis not present

## 2018-08-27 DIAGNOSIS — E041 Nontoxic single thyroid nodule: Secondary | ICD-10-CM | POA: Diagnosis not present

## 2018-09-16 DIAGNOSIS — R221 Localized swelling, mass and lump, neck: Secondary | ICD-10-CM | POA: Diagnosis not present

## 2018-09-18 DIAGNOSIS — J343 Hypertrophy of nasal turbinates: Secondary | ICD-10-CM | POA: Diagnosis not present

## 2018-09-18 DIAGNOSIS — R591 Generalized enlarged lymph nodes: Secondary | ICD-10-CM | POA: Diagnosis not present

## 2018-09-19 DIAGNOSIS — Z1159 Encounter for screening for other viral diseases: Secondary | ICD-10-CM | POA: Diagnosis not present

## 2018-09-19 DIAGNOSIS — R591 Generalized enlarged lymph nodes: Secondary | ICD-10-CM | POA: Diagnosis not present

## 2018-09-19 DIAGNOSIS — J343 Hypertrophy of nasal turbinates: Secondary | ICD-10-CM | POA: Diagnosis not present

## 2018-09-19 DIAGNOSIS — Z01812 Encounter for preprocedural laboratory examination: Secondary | ICD-10-CM | POA: Diagnosis not present

## 2018-09-25 DIAGNOSIS — E041 Nontoxic single thyroid nodule: Secondary | ICD-10-CM | POA: Diagnosis not present

## 2018-09-25 DIAGNOSIS — J343 Hypertrophy of nasal turbinates: Secondary | ICD-10-CM | POA: Diagnosis not present

## 2018-09-25 DIAGNOSIS — J3489 Other specified disorders of nose and nasal sinuses: Secondary | ICD-10-CM | POA: Diagnosis not present

## 2018-09-25 DIAGNOSIS — R591 Generalized enlarged lymph nodes: Secondary | ICD-10-CM | POA: Diagnosis not present

## 2018-09-25 DIAGNOSIS — R590 Localized enlarged lymph nodes: Secondary | ICD-10-CM | POA: Diagnosis not present

## 2018-09-25 DIAGNOSIS — R221 Localized swelling, mass and lump, neck: Secondary | ICD-10-CM | POA: Diagnosis not present

## 2018-10-28 DIAGNOSIS — R591 Generalized enlarged lymph nodes: Secondary | ICD-10-CM | POA: Diagnosis not present

## 2018-10-28 DIAGNOSIS — R61 Generalized hyperhidrosis: Secondary | ICD-10-CM | POA: Diagnosis not present

## 2018-11-07 DIAGNOSIS — R590 Localized enlarged lymph nodes: Secondary | ICD-10-CM | POA: Diagnosis not present

## 2018-11-07 DIAGNOSIS — R61 Generalized hyperhidrosis: Secondary | ICD-10-CM | POA: Diagnosis not present

## 2018-11-07 DIAGNOSIS — L299 Pruritus, unspecified: Secondary | ICD-10-CM | POA: Diagnosis not present

## 2018-11-07 DIAGNOSIS — E041 Nontoxic single thyroid nodule: Secondary | ICD-10-CM | POA: Diagnosis not present

## 2018-11-07 DIAGNOSIS — J343 Hypertrophy of nasal turbinates: Secondary | ICD-10-CM | POA: Diagnosis not present

## 2018-11-07 DIAGNOSIS — R0602 Shortness of breath: Secondary | ICD-10-CM | POA: Diagnosis not present

## 2018-11-07 DIAGNOSIS — R509 Fever, unspecified: Secondary | ICD-10-CM | POA: Diagnosis not present

## 2018-11-21 DIAGNOSIS — R61 Generalized hyperhidrosis: Secondary | ICD-10-CM | POA: Diagnosis not present

## 2019-03-05 DIAGNOSIS — R102 Pelvic and perineal pain: Secondary | ICD-10-CM | POA: Diagnosis not present

## 2019-03-13 DIAGNOSIS — M4184 Other forms of scoliosis, thoracic region: Secondary | ICD-10-CM | POA: Diagnosis not present

## 2019-03-13 DIAGNOSIS — M546 Pain in thoracic spine: Secondary | ICD-10-CM | POA: Diagnosis not present

## 2019-03-13 DIAGNOSIS — G8929 Other chronic pain: Secondary | ICD-10-CM | POA: Diagnosis not present

## 2019-03-13 DIAGNOSIS — M41114 Juvenile idiopathic scoliosis, thoracic region: Secondary | ICD-10-CM | POA: Diagnosis not present

## 2019-03-17 DIAGNOSIS — R194 Change in bowel habit: Secondary | ICD-10-CM | POA: Diagnosis not present

## 2019-03-17 DIAGNOSIS — N912 Amenorrhea, unspecified: Secondary | ICD-10-CM | POA: Diagnosis not present

## 2019-03-17 DIAGNOSIS — N926 Irregular menstruation, unspecified: Secondary | ICD-10-CM | POA: Diagnosis not present

## 2019-03-17 DIAGNOSIS — R102 Pelvic and perineal pain: Secondary | ICD-10-CM | POA: Diagnosis not present

## 2019-03-17 DIAGNOSIS — N83201 Unspecified ovarian cyst, right side: Secondary | ICD-10-CM | POA: Diagnosis not present

## 2019-03-26 DIAGNOSIS — E782 Mixed hyperlipidemia: Secondary | ICD-10-CM | POA: Diagnosis not present

## 2019-03-26 DIAGNOSIS — D518 Other vitamin B12 deficiency anemias: Secondary | ICD-10-CM | POA: Diagnosis not present

## 2019-03-26 DIAGNOSIS — E559 Vitamin D deficiency, unspecified: Secondary | ICD-10-CM | POA: Diagnosis not present

## 2019-03-26 DIAGNOSIS — E119 Type 2 diabetes mellitus without complications: Secondary | ICD-10-CM | POA: Diagnosis not present

## 2019-03-26 DIAGNOSIS — E038 Other specified hypothyroidism: Secondary | ICD-10-CM | POA: Diagnosis not present

## 2019-03-26 DIAGNOSIS — I10 Essential (primary) hypertension: Secondary | ICD-10-CM | POA: Diagnosis not present

## 2019-03-27 DIAGNOSIS — R194 Change in bowel habit: Secondary | ICD-10-CM | POA: Diagnosis not present

## 2019-03-27 DIAGNOSIS — K921 Melena: Secondary | ICD-10-CM | POA: Diagnosis not present

## 2019-03-27 DIAGNOSIS — R748 Abnormal levels of other serum enzymes: Secondary | ICD-10-CM | POA: Diagnosis not present

## 2019-03-27 DIAGNOSIS — R197 Diarrhea, unspecified: Secondary | ICD-10-CM | POA: Diagnosis not present

## 2019-03-27 DIAGNOSIS — R634 Abnormal weight loss: Secondary | ICD-10-CM | POA: Diagnosis not present

## 2019-04-02 DIAGNOSIS — R748 Abnormal levels of other serum enzymes: Secondary | ICD-10-CM | POA: Diagnosis not present

## 2019-04-02 DIAGNOSIS — R197 Diarrhea, unspecified: Secondary | ICD-10-CM | POA: Diagnosis not present

## 2019-04-02 DIAGNOSIS — R945 Abnormal results of liver function studies: Secondary | ICD-10-CM | POA: Diagnosis not present

## 2019-04-02 DIAGNOSIS — R194 Change in bowel habit: Secondary | ICD-10-CM | POA: Diagnosis not present

## 2019-04-10 DIAGNOSIS — R110 Nausea: Secondary | ICD-10-CM | POA: Diagnosis not present

## 2019-04-10 DIAGNOSIS — K6389 Other specified diseases of intestine: Secondary | ICD-10-CM | POA: Diagnosis not present

## 2019-04-10 DIAGNOSIS — R197 Diarrhea, unspecified: Secondary | ICD-10-CM | POA: Diagnosis not present

## 2019-04-10 DIAGNOSIS — K3189 Other diseases of stomach and duodenum: Secondary | ICD-10-CM | POA: Diagnosis not present

## 2019-04-10 DIAGNOSIS — K648 Other hemorrhoids: Secondary | ICD-10-CM | POA: Diagnosis not present

## 2019-04-10 DIAGNOSIS — K921 Melena: Secondary | ICD-10-CM | POA: Diagnosis not present

## 2019-04-10 DIAGNOSIS — K229 Disease of esophagus, unspecified: Secondary | ICD-10-CM | POA: Diagnosis not present

## 2019-04-10 DIAGNOSIS — R634 Abnormal weight loss: Secondary | ICD-10-CM | POA: Diagnosis not present

## 2019-04-10 DIAGNOSIS — R109 Unspecified abdominal pain: Secondary | ICD-10-CM | POA: Diagnosis not present

## 2019-04-30 DIAGNOSIS — N83201 Unspecified ovarian cyst, right side: Secondary | ICD-10-CM | POA: Diagnosis not present

## 2019-04-30 DIAGNOSIS — R102 Pelvic and perineal pain: Secondary | ICD-10-CM | POA: Diagnosis not present

## 2019-04-30 DIAGNOSIS — N939 Abnormal uterine and vaginal bleeding, unspecified: Secondary | ICD-10-CM | POA: Diagnosis not present

## 2019-05-01 DIAGNOSIS — R05 Cough: Secondary | ICD-10-CM | POA: Diagnosis not present

## 2019-05-01 DIAGNOSIS — E559 Vitamin D deficiency, unspecified: Secondary | ICD-10-CM | POA: Diagnosis not present

## 2019-05-01 DIAGNOSIS — Z1331 Encounter for screening for depression: Secondary | ICD-10-CM | POA: Diagnosis not present

## 2019-05-01 DIAGNOSIS — Z111 Encounter for screening for respiratory tuberculosis: Secondary | ICD-10-CM | POA: Diagnosis not present

## 2019-05-01 DIAGNOSIS — R590 Localized enlarged lymph nodes: Secondary | ICD-10-CM | POA: Diagnosis not present

## 2019-05-01 DIAGNOSIS — F988 Other specified behavioral and emotional disorders with onset usually occurring in childhood and adolescence: Secondary | ICD-10-CM | POA: Diagnosis not present

## 2019-05-20 DIAGNOSIS — N941 Unspecified dyspareunia: Secondary | ICD-10-CM | POA: Diagnosis not present

## 2019-05-20 DIAGNOSIS — N946 Dysmenorrhea, unspecified: Secondary | ICD-10-CM | POA: Diagnosis not present

## 2019-05-20 DIAGNOSIS — N920 Excessive and frequent menstruation with regular cycle: Secondary | ICD-10-CM | POA: Diagnosis not present

## 2019-05-28 DIAGNOSIS — R109 Unspecified abdominal pain: Secondary | ICD-10-CM | POA: Diagnosis not present

## 2019-05-28 DIAGNOSIS — R590 Localized enlarged lymph nodes: Secondary | ICD-10-CM | POA: Diagnosis not present

## 2019-06-04 DIAGNOSIS — R591 Generalized enlarged lymph nodes: Secondary | ICD-10-CM | POA: Diagnosis not present

## 2019-06-04 DIAGNOSIS — L739 Follicular disorder, unspecified: Secondary | ICD-10-CM | POA: Diagnosis not present

## 2019-06-30 DIAGNOSIS — F419 Anxiety disorder, unspecified: Secondary | ICD-10-CM | POA: Diagnosis not present

## 2019-06-30 DIAGNOSIS — F988 Other specified behavioral and emotional disorders with onset usually occurring in childhood and adolescence: Secondary | ICD-10-CM | POA: Diagnosis not present

## 2019-07-05 HISTORY — PX: LAPAROSCOPIC HYSTERECTOMY: SHX1926

## 2019-07-09 DIAGNOSIS — Z01818 Encounter for other preprocedural examination: Secondary | ICD-10-CM | POA: Diagnosis not present

## 2019-07-09 DIAGNOSIS — Z01812 Encounter for preprocedural laboratory examination: Secondary | ICD-10-CM | POA: Diagnosis not present

## 2019-07-09 DIAGNOSIS — Z20822 Contact with and (suspected) exposure to covid-19: Secondary | ICD-10-CM | POA: Diagnosis not present

## 2019-07-09 DIAGNOSIS — N920 Excessive and frequent menstruation with regular cycle: Secondary | ICD-10-CM | POA: Diagnosis not present

## 2019-07-09 DIAGNOSIS — N946 Dysmenorrhea, unspecified: Secondary | ICD-10-CM | POA: Diagnosis not present

## 2019-07-09 DIAGNOSIS — N941 Unspecified dyspareunia: Secondary | ICD-10-CM | POA: Diagnosis not present

## 2019-07-15 DIAGNOSIS — N72 Inflammatory disease of cervix uteri: Secondary | ICD-10-CM | POA: Diagnosis not present

## 2019-07-15 DIAGNOSIS — N920 Excessive and frequent menstruation with regular cycle: Secondary | ICD-10-CM | POA: Diagnosis not present

## 2019-07-15 DIAGNOSIS — D282 Benign neoplasm of uterine tubes and ligaments: Secondary | ICD-10-CM | POA: Diagnosis not present

## 2019-07-15 DIAGNOSIS — N941 Unspecified dyspareunia: Secondary | ICD-10-CM | POA: Diagnosis not present

## 2019-07-15 DIAGNOSIS — N946 Dysmenorrhea, unspecified: Secondary | ICD-10-CM | POA: Diagnosis not present

## 2019-07-15 DIAGNOSIS — F419 Anxiety disorder, unspecified: Secondary | ICD-10-CM | POA: Diagnosis not present

## 2019-07-16 DIAGNOSIS — N920 Excessive and frequent menstruation with regular cycle: Secondary | ICD-10-CM | POA: Diagnosis not present

## 2019-07-16 DIAGNOSIS — F419 Anxiety disorder, unspecified: Secondary | ICD-10-CM | POA: Diagnosis not present

## 2019-07-16 DIAGNOSIS — N941 Unspecified dyspareunia: Secondary | ICD-10-CM | POA: Diagnosis not present

## 2019-07-16 DIAGNOSIS — N946 Dysmenorrhea, unspecified: Secondary | ICD-10-CM | POA: Diagnosis not present

## 2019-07-27 DIAGNOSIS — T7840XA Allergy, unspecified, initial encounter: Secondary | ICD-10-CM | POA: Diagnosis not present

## 2019-07-27 DIAGNOSIS — X58XXXA Exposure to other specified factors, initial encounter: Secondary | ICD-10-CM | POA: Diagnosis not present

## 2019-07-27 DIAGNOSIS — L509 Urticaria, unspecified: Secondary | ICD-10-CM | POA: Diagnosis not present

## 2019-07-27 DIAGNOSIS — Z90710 Acquired absence of both cervix and uterus: Secondary | ICD-10-CM | POA: Diagnosis not present

## 2019-07-28 DIAGNOSIS — L508 Other urticaria: Secondary | ICD-10-CM | POA: Diagnosis not present

## 2019-07-28 DIAGNOSIS — R220 Localized swelling, mass and lump, head: Secondary | ICD-10-CM | POA: Diagnosis not present

## 2019-07-28 DIAGNOSIS — T7840XA Allergy, unspecified, initial encounter: Secondary | ICD-10-CM | POA: Diagnosis not present

## 2019-07-28 DIAGNOSIS — R1310 Dysphagia, unspecified: Secondary | ICD-10-CM | POA: Diagnosis not present

## 2019-07-28 DIAGNOSIS — L500 Allergic urticaria: Secondary | ICD-10-CM | POA: Diagnosis not present

## 2019-07-28 DIAGNOSIS — X58XXXA Exposure to other specified factors, initial encounter: Secondary | ICD-10-CM | POA: Diagnosis not present

## 2019-07-29 DIAGNOSIS — L508 Other urticaria: Secondary | ICD-10-CM | POA: Diagnosis not present

## 2019-07-29 DIAGNOSIS — R1310 Dysphagia, unspecified: Secondary | ICD-10-CM | POA: Diagnosis not present

## 2019-07-29 DIAGNOSIS — T7840XA Allergy, unspecified, initial encounter: Secondary | ICD-10-CM | POA: Diagnosis not present

## 2019-07-29 DIAGNOSIS — X58XXXA Exposure to other specified factors, initial encounter: Secondary | ICD-10-CM | POA: Diagnosis not present

## 2019-07-30 DIAGNOSIS — J392 Other diseases of pharynx: Secondary | ICD-10-CM | POA: Diagnosis not present

## 2019-07-30 DIAGNOSIS — T7840XA Allergy, unspecified, initial encounter: Secondary | ICD-10-CM | POA: Diagnosis not present

## 2019-07-30 DIAGNOSIS — F419 Anxiety disorder, unspecified: Secondary | ICD-10-CM | POA: Diagnosis not present

## 2019-07-30 DIAGNOSIS — E041 Nontoxic single thyroid nodule: Secondary | ICD-10-CM | POA: Diagnosis not present

## 2019-07-30 DIAGNOSIS — T380X5A Adverse effect of glucocorticoids and synthetic analogues, initial encounter: Secondary | ICD-10-CM | POA: Diagnosis not present

## 2019-07-30 DIAGNOSIS — F429 Obsessive-compulsive disorder, unspecified: Secondary | ICD-10-CM | POA: Diagnosis not present

## 2019-07-30 DIAGNOSIS — R7401 Elevation of levels of liver transaminase levels: Secondary | ICD-10-CM | POA: Diagnosis not present

## 2019-07-30 DIAGNOSIS — J309 Allergic rhinitis, unspecified: Secondary | ICD-10-CM | POA: Diagnosis not present

## 2019-07-30 DIAGNOSIS — R0602 Shortness of breath: Secondary | ICD-10-CM | POA: Diagnosis not present

## 2019-07-30 DIAGNOSIS — F418 Other specified anxiety disorders: Secondary | ICD-10-CM | POA: Diagnosis not present

## 2019-07-30 DIAGNOSIS — L501 Idiopathic urticaria: Secondary | ICD-10-CM | POA: Diagnosis not present

## 2019-07-30 DIAGNOSIS — R590 Localized enlarged lymph nodes: Secondary | ICD-10-CM | POA: Diagnosis not present

## 2019-07-30 DIAGNOSIS — X58XXXA Exposure to other specified factors, initial encounter: Secondary | ICD-10-CM | POA: Diagnosis not present

## 2019-07-30 DIAGNOSIS — F329 Major depressive disorder, single episode, unspecified: Secondary | ICD-10-CM | POA: Diagnosis not present

## 2019-07-30 DIAGNOSIS — T783XXA Angioneurotic edema, initial encounter: Secondary | ICD-10-CM | POA: Diagnosis not present

## 2019-07-30 DIAGNOSIS — Z7952 Long term (current) use of systemic steroids: Secondary | ICD-10-CM | POA: Diagnosis not present

## 2019-07-31 DIAGNOSIS — L501 Idiopathic urticaria: Secondary | ICD-10-CM | POA: Diagnosis not present

## 2019-07-31 DIAGNOSIS — L509 Urticaria, unspecified: Secondary | ICD-10-CM | POA: Diagnosis not present

## 2019-07-31 DIAGNOSIS — F418 Other specified anxiety disorders: Secondary | ICD-10-CM | POA: Diagnosis not present

## 2019-07-31 DIAGNOSIS — F429 Obsessive-compulsive disorder, unspecified: Secondary | ICD-10-CM | POA: Diagnosis not present

## 2019-07-31 DIAGNOSIS — R7401 Elevation of levels of liver transaminase levels: Secondary | ICD-10-CM | POA: Diagnosis not present

## 2019-08-01 DIAGNOSIS — L501 Idiopathic urticaria: Secondary | ICD-10-CM | POA: Diagnosis not present

## 2019-08-01 DIAGNOSIS — R000 Tachycardia, unspecified: Secondary | ICD-10-CM | POA: Diagnosis not present

## 2019-08-01 DIAGNOSIS — L509 Urticaria, unspecified: Secondary | ICD-10-CM | POA: Diagnosis not present

## 2019-08-01 DIAGNOSIS — F418 Other specified anxiety disorders: Secondary | ICD-10-CM | POA: Diagnosis not present

## 2019-08-01 DIAGNOSIS — R7401 Elevation of levels of liver transaminase levels: Secondary | ICD-10-CM | POA: Diagnosis not present

## 2019-08-01 DIAGNOSIS — F429 Obsessive-compulsive disorder, unspecified: Secondary | ICD-10-CM | POA: Diagnosis not present

## 2019-08-05 DIAGNOSIS — R899 Unspecified abnormal finding in specimens from other organs, systems and tissues: Secondary | ICD-10-CM | POA: Diagnosis not present

## 2019-08-05 DIAGNOSIS — Z09 Encounter for follow-up examination after completed treatment for conditions other than malignant neoplasm: Secondary | ICD-10-CM | POA: Diagnosis not present

## 2019-08-05 DIAGNOSIS — R748 Abnormal levels of other serum enzymes: Secondary | ICD-10-CM | POA: Diagnosis not present

## 2019-08-05 DIAGNOSIS — T7840XA Allergy, unspecified, initial encounter: Secondary | ICD-10-CM | POA: Diagnosis not present

## 2019-08-13 DIAGNOSIS — R945 Abnormal results of liver function studies: Secondary | ICD-10-CM | POA: Diagnosis not present

## 2019-08-13 DIAGNOSIS — R140 Abdominal distension (gaseous): Secondary | ICD-10-CM | POA: Diagnosis not present

## 2019-08-22 DIAGNOSIS — D1809 Hemangioma of other sites: Secondary | ICD-10-CM | POA: Diagnosis not present

## 2019-08-22 DIAGNOSIS — R945 Abnormal results of liver function studies: Secondary | ICD-10-CM | POA: Diagnosis not present

## 2019-08-22 DIAGNOSIS — R140 Abdominal distension (gaseous): Secondary | ICD-10-CM | POA: Diagnosis not present

## 2019-08-28 DIAGNOSIS — Z92241 Personal history of systemic steroid therapy: Secondary | ICD-10-CM | POA: Diagnosis not present

## 2019-08-28 DIAGNOSIS — L509 Urticaria, unspecified: Secondary | ICD-10-CM | POA: Diagnosis not present

## 2019-08-28 DIAGNOSIS — Z79899 Other long term (current) drug therapy: Secondary | ICD-10-CM | POA: Diagnosis not present

## 2019-08-28 DIAGNOSIS — L501 Idiopathic urticaria: Secondary | ICD-10-CM | POA: Diagnosis not present

## 2019-09-11 DIAGNOSIS — D4709 Other mast cell neoplasms of uncertain behavior: Secondary | ICD-10-CM | POA: Diagnosis not present

## 2019-09-11 DIAGNOSIS — E538 Deficiency of other specified B group vitamins: Secondary | ICD-10-CM | POA: Diagnosis not present

## 2019-09-11 DIAGNOSIS — R600 Localized edema: Secondary | ICD-10-CM | POA: Diagnosis not present

## 2019-09-11 DIAGNOSIS — E559 Vitamin D deficiency, unspecified: Secondary | ICD-10-CM | POA: Diagnosis not present

## 2019-09-16 DIAGNOSIS — L503 Dermatographic urticaria: Secondary | ICD-10-CM | POA: Diagnosis not present

## 2019-09-16 DIAGNOSIS — L501 Idiopathic urticaria: Secondary | ICD-10-CM | POA: Diagnosis not present

## 2019-10-08 ENCOUNTER — Ambulatory Visit: Payer: BC Managed Care – PPO | Admitting: Allergy and Immunology

## 2019-10-08 ENCOUNTER — Other Ambulatory Visit: Payer: Self-pay

## 2019-10-08 ENCOUNTER — Encounter: Payer: Self-pay | Admitting: Allergy and Immunology

## 2019-10-08 VITALS — BP 114/74 | HR 76 | Temp 98.7°F | Resp 16 | Ht 63.8 in | Wt 180.8 lb

## 2019-10-08 DIAGNOSIS — J3089 Other allergic rhinitis: Secondary | ICD-10-CM

## 2019-10-08 DIAGNOSIS — L500 Allergic urticaria: Secondary | ICD-10-CM

## 2019-10-08 DIAGNOSIS — J324 Chronic pansinusitis: Secondary | ICD-10-CM | POA: Diagnosis not present

## 2019-10-08 DIAGNOSIS — T782XXD Anaphylactic shock, unspecified, subsequent encounter: Secondary | ICD-10-CM | POA: Diagnosis not present

## 2019-10-08 DIAGNOSIS — R197 Diarrhea, unspecified: Secondary | ICD-10-CM

## 2019-10-08 NOTE — Progress Notes (Signed)
[place] - [place] - [place] - [place] - [place]   Dear Dr. Collene Mares,  Thank you for referring Regina Bradley to the [facility] on 10/08/2019.   Below is a summation of this patient's evaluation and recommendations.  Thank you for your referral. I will keep you informed about this patient's response to treatment.   If you have any questions please do not hesitate to contact me.   Sincerely,  Jiles Prows, MD Allergy / Immunology [place]   ______________________________________________________________________    NEW PATIENT NOTE  Referring Provider: Juanita Craver, MD Primary Provider: Earlyne Iba, NP Date of office visit: 10/08/2019    Subjective:   Chief Complaint:  Regina Bradley (DOB: 20-Jun-1985) is a 34 y.o. female who presents to the clinic on 10/08/2019 with a chief complaint of Allergic Reaction .     HPI: Regina Bradley presents to this clinic in evaluation of 3 main issues.  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  This reactivity started about 2 weeks after that hysterectomy.  She has been to the emergency room on multiple times and has received epinephrine multiple times for global urticaria and diarrhea and stridor and facial swelling.  She was admitted to [facility] and evaluated by both dermatology and allergy and no etiologic factor could be determined for this overactivity.  She apparently did have elevated liver function test around that point in time but that has since resolved.  Her tryptase level was never particularly high and a baseline tryptase was below 5 NG/ML.  She was subsequently placed on Zyrtec and Pepcid and Singulair and during June did relatively well but she had some problems with bloating of her abdomen for which she stopped all of her  medications over the course of the past 3 weeks.  She still continues to have intermittent red blotchy flat lesions and basically has skin that turns red with scratching or pressure application.  Her last systemic steroid administration was finished in early June 2021.  Second, she apparently has had a year of sinus problems and underwent sinus surgery including a cervical lymph node biopsy in July 2020 with Dr. Laurance Flatten.  She has had no change as a result of that surgery and she still continues to have persistent nasal congestion and some occasional decreased ability to smell.  She does not use any nasal steroids on a regular basis.  Third, she apparently has been having chronic diarrhea.  Every morning she wakes up she has diarrhea.  She has had some bloating in her abdomen as well.  She has seen a gastroenterologist for this issue and has had a colonoscopy which has not identified any significant abnormality.  Apparently she has had negative blood test for celiac disease.  She is now predominantly dairy consumption free but still continues to have diarrhea.  Past Medical History:  Diagnosis Date  . ADD (attention deficit disorder)   . Carpal tunnel syndrome 2010   right hand   . History of echocardiogram 8/11   Nml EF trace MR/TR, CVP 0-56mmHg. EF 55-60%    Past Surgical History:  Procedure Laterality Date  . ADENOIDECTOMY    . CARPAL TUNNEL RELEASE Right 2010  . CESAREAN SECTION  2012  . DEEP NECK LYMPH NODE BIOPSY / EXCISION Right   . LAPAROSCOPIC HYSTERECTOMY  07/2019   Left  Ovaries  . SINOSCOPY    . TONSILLECTOMY    . TONSILLECTOMY    . TUBAL LIGATION  2012    Allergies as of 10/08/2019      Reactions   Azithromycin Nausea And Vomiting   Ceclor [cefaclor] Hives      Medication List    EPINEPHrine 0.3 mg/0.3 mL Soaj injection Commonly known as: EPI-PEN SMARTSIG:0.3 Milligram(s) IM Once PRN   Vitamin D (Ergocalciferol) 1.25 MG (50000 UNIT) Caps capsule Commonly known as:  DRISDOL Take by mouth.   Vyvanse 60 MG capsule Generic drug: lisdexamfetamine Take 60 mg by mouth at bedtime.       Review of systems negative except as noted in HPI / PMHx or noted below:  Review of Systems  Constitutional: Negative.   HENT: Negative.   Eyes: Negative.   Respiratory: Negative.   Cardiovascular: Negative.   Gastrointestinal: Negative.   Genitourinary: Negative.   Musculoskeletal: Negative.   Skin: Negative.   Neurological: Negative.   Endo/Heme/Allergies: Negative.   Psychiatric/Behavioral: Negative.     Family History  Problem Relation Age of Onset  . Lupus Father        possible  . Heart attack Paternal Grandfather     Social History   Socioeconomic History  . Marital status: Married    Spouse name: Not on file  . Number of children: Not on file  . Years of education: Not on file  . Highest education level: Not on file  Occupational History  . Not on file  Tobacco Use  . Smoking status: Never Smoker  . Smokeless tobacco: Never Used  Substance and Sexual Activity  . Alcohol use: Yes    Comment: Ocassionally  . Drug use: No  . Sexual activity: Not on file  Other Topics Concern  . Not on file  Social History Narrative  . Not on file    Environmental and Social history  Lives in a house with a dry environment, dog look inside the household, no carpet in the bedroom, plastic on the bed, no plastic on the pillow, no smoking ongoing with inside the household.  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   Objective:   Vitals:   10/08/19 0946  BP: 114/74  Pulse: 76  Resp: 16  Temp: 98.7 F (37.1 C)  SpO2: 99%   Height: 5' 3.8" (162.1 cm) Weight: 180 lb 12.8 oz (82 kg)  Physical Exam Constitutional:      Appearance: She is not diaphoretic.  HENT:     Head: Normocephalic.     Right Ear: Tympanic membrane, ear canal and external ear normal.     Left Ear: Tympanic membrane, ear canal and external ear normal.     Nose:  Nose normal. No mucosal edema or rhinorrhea.     Mouth/Throat:     Pharynx: Uvula midline. No oropharyngeal exudate.  Eyes:     Conjunctiva/sclera: Conjunctivae normal.  Neck:     Thyroid: No thyromegaly.     Trachea: Trachea normal. No tracheal tenderness or tracheal deviation.  Cardiovascular:     Rate and Rhythm: Normal rate and regular rhythm.     Heart sounds: Normal heart sounds, S1 normal and S2 normal. No murmur heard.   Pulmonary:     Effort: No respiratory distress.     Breath sounds: Normal breath sounds. No stridor. No wheezing or rales.  Lymphadenopathy:     Head:     Right side of head: No  tonsillar adenopathy.     Left side of head: No tonsillar adenopathy.     Cervical: No cervical adenopathy.  Skin:    Findings: Rash (Global blanching erythema of skin with dermatographia) present. No erythema.     Nails: There is no clubbing.  Neurological:     Mental Status: She is alert.     Diagnostics: Allergy skin tests were not performed.   Results of blood tests obtained 28 August 2019 identified negative IgE antibody food panel other than milk at 0.16 KU/L and a negative beef IgE at less than 0.10 KU/L, tryptase 3.8 NG/mL, negative shellfish panel, negative nut panel, negative strawberry and tomato IgE.  Results of blood tests obtained 27 March 2019 identified negative IgA and IgG tissue transglutaminase levels.  No baseline IgA level was obtained.  Results of blood tests obtained 01 Aug 2019 identified negative viral hepatitis panel, negative mono serology, WBC 11.3, absolute eosinophil 200, absolute lymphocyte 1700, hemoglobin 12.6, platelet 309  Results of blood tests obtained 27 March 2019 identified negative alpha 1 antitrypsin phenotype, negative ceruloplasmin, negative smooth muscle antibody screen, negative ANA, negative my antimitochondrial antibody.  Results of blood tests obtained 06 June 2018 identified a negative aero allergen IgE panel.  Review of a  sinus CT scan obtained 21 May 2018 identified the following:  Paranasal sinuses:   Frontal: Normally aerated. Patent frontal sinus drainage pathways.   Ethmoid: Mild-to-moderate mucosal thickening seen involving predominantly the right-sided ethmoidal air cells., more pronounced posteriorly.   Maxillary: Mild-to-moderate polypoid mucosal thickening about the maxillary sinuses bilaterally, right slightly worse than left.   Sphenoid: Normally aerated. Patent sphenoethmoidal recesses.   Right ostiomeatal unit: Mucosal thickening with a infundibular narrowing and occlusion of the right ostiomeatal unit.   Left ostiomeatal unit: Mild mucosal thickening about the left ostiomeatal unit without occlusion.   Nasal passages: 4 mm left-to-right nasal septal deviation with associated spur, partially obstructing the right nasal cavity. Right middle turbinates somewhat hypoplastic. Nasal cavities otherwise patent.   Other: Partially visualized mastoid air cells and middle ear cavities are clear.   Assessment and Plan:    1. Allergic urticaria   2. Anaphylaxis, subsequent encounter   3. Perennial allergic rhinitis   4. Chronic pansinusitis   5. Diarrhea, unspecified type     1.  Continue the following every day:   A. Cetirizine 10 mg - 1 tablet 2 times per day  B. OTC budesonide - 1 spray each nostril 1 time per day after saline  2. If needed:   A. Epi-pen, benadryl, MD/ER evaluation for allergic reaction  3. Blood - Milk components, Alpha-gal panel, IgA/G/M, anti-pneumo ab, anti-tetanus ab  4. Further evaluation and treatment? Omalizumab?   5. Obtain covid vaccine and fall flu vaccine  6. Return in 8 weeks or earlier if problem. Taper?  Regina Bradley has immunological hyperreactivity that presented itself during the spring with a multisystemic disorder associated with anaphylaxis.  The etiology of this immune activation is not entirely clear.  It certainly could have been a delayed  reaction from one of the medication she received as a result of her hysterectomy procedure.  It should be noted that she has developed a problem with chronic sinusitis last year and still continues to have problems with her upper airways.  All of the diagnostic testing looking at antibodies directed against atopic disease and various other diseases have been negative.  It is quite possible that she has a B-cell immune defect and cannot make antibodies  and we need to exclude that possibility with the blood test noted above.  If that is the case then diagnostic evaluation for the etiologic factor giving rise to her immune activation the spring and her chronic diarrhea will need to include nonantibody associated diagnostic testing.  Should she continue with significant immune activation we may need to place her on omalizumab.  For now I would like for her to use an antihistamine every day and we will treat her with nasal steroids on a consistent basis to hopefully prevent the redevelopment of significant chronic sinusitis.  Jiles Prows, MD Allergy / Immunology [facility]

## 2019-10-08 NOTE — Patient Instructions (Addendum)
  1.  Continue the following every day:   A. Cetirizine 10 mg - 1 tablet 2 times per day  B. OTC budesonide - 1 spray each nostril 1 time per day after saline  2. If needed:   A. Epi-pen, benadryl, MD/ER evaluation for allergic reaction  3. Blood - Milk components, Alpha-gal panel, IgA/G/M, anti-pneumo ab, anti-tetanus ab  4. Further evaluation and treatment? Omalizumab?   5. Obtain covid vaccine and fall flu vaccine  6. Return in 8 weeks or earlier if problem. Taper?

## 2019-10-09 ENCOUNTER — Encounter: Payer: Self-pay | Admitting: Allergy and Immunology

## 2019-10-14 ENCOUNTER — Telehealth: Payer: Self-pay | Admitting: Allergy and Immunology

## 2019-10-14 NOTE — Telephone Encounter (Signed)
Patient informed. 

## 2019-10-14 NOTE — Telephone Encounter (Signed)
Please inform patient that all blood tests have not returned yet.  It will probably be an additional week until they all return.

## 2019-10-14 NOTE — Telephone Encounter (Signed)
PT called to discuss lab results. °

## 2019-10-17 ENCOUNTER — Encounter: Payer: Self-pay | Admitting: Allergy and Immunology

## 2019-10-21 LAB — ALPHA-GAL PANEL
Alpha Gal IgE*: <0.1 kU/L (ref <0.10)
Beef (Bos spp) IgE: <0.1 kU/L (ref <0.35)
Class Interpretation: 0
Class Interpretation: 0
Class Interpretation: 0
Lamb/Mutton (Ovis spp) IgE: <0.1 kU/L (ref <0.35)
Pork (Sus spp) IgE: <0.1 kU/L (ref <0.35)

## 2019-10-21 LAB — STREP PNEUMONIAE 23 SEROTYPES IGG
Pneumo Ab Type 1*: 0.5 ug/mL — ABNORMAL LOW (ref >1.3)
Pneumo Ab Type 12 (12F)*: 2.1 ug/mL (ref >1.3)
Pneumo Ab Type 14*: 6.1 ug/mL (ref >1.3)
Pneumo Ab Type 17 (17F)*: 0.9 ug/mL — ABNORMAL LOW (ref >1.3)
Pneumo Ab Type 19 (19F)*: 2.6 ug/mL (ref >1.3)
Pneumo Ab Type 2*: 1.1 ug/mL — ABNORMAL LOW (ref >1.3)
Pneumo Ab Type 20*: 1.7 ug/mL (ref >1.3)
Pneumo Ab Type 22 (22F)*: 1.8 ug/mL (ref >1.3)
Pneumo Ab Type 23 (23F)*: 2.1 ug/mL (ref >1.3)
Pneumo Ab Type 26 (6B)*: 0.3 ug/mL — ABNORMAL LOW (ref >1.3)
Pneumo Ab Type 3*: 2.9 ug/mL (ref >1.3)
Pneumo Ab Type 34 (10A)*: 2.7 ug/mL (ref >1.3)
Pneumo Ab Type 4*: 0.4 ug/mL — ABNORMAL LOW (ref >1.3)
Pneumo Ab Type 43 (11A)*: 1.6 ug/mL (ref >1.3)
Pneumo Ab Type 5*: 0.3 ug/mL — ABNORMAL LOW (ref >1.3)
Pneumo Ab Type 51 (7F)*: 1.4 ug/mL (ref >1.3)
Pneumo Ab Type 54 (15B)*: 1.9 ug/mL (ref >1.3)
Pneumo Ab Type 56 (18C)*: 0.3 ug/mL — ABNORMAL LOW (ref >1.3)
Pneumo Ab Type 57 (19A)*: 3.9 ug/mL (ref >1.3)
Pneumo Ab Type 68 (9V)*: 0.4 ug/mL — ABNORMAL LOW (ref >1.3)
Pneumo Ab Type 70 (33F)*: 0.5 ug/mL — ABNORMAL LOW (ref >1.3)
Pneumo Ab Type 8*: 0.2 ug/mL — ABNORMAL LOW (ref >1.3)
Pneumo Ab Type 9 (9N)*: 0.7 ug/mL — ABNORMAL LOW (ref >1.3)

## 2019-10-21 LAB — MILK COMPONENT PANEL
F076-IgE Alpha Lactalbumin: <0.1 kU/L
F077-IgE Beta Lactoglobulin: <0.1 kU/L
F078-IgE Casein: <0.1 kU/L

## 2019-10-21 LAB — IGG, IGA, IGM
IgA/Immunoglobulin A, Serum: 278 mg/dL (ref 87–352)
IgG (Immunoglobin G), Serum: 799 mg/dL (ref 586–1602)
IgM (Immunoglobulin M), Srm: 96 mg/dL (ref 26–217)

## 2019-10-21 LAB — TETANUS ANTIBODY, IGG: Tetanus Ab, IgG: 2.8 IU/mL (ref <0.10)

## 2019-10-22 ENCOUNTER — Encounter: Payer: Self-pay | Admitting: Allergy and Immunology

## 2019-10-29 DIAGNOSIS — L814 Other melanin hyperpigmentation: Secondary | ICD-10-CM | POA: Diagnosis not present

## 2019-10-29 DIAGNOSIS — B079 Viral wart, unspecified: Secondary | ICD-10-CM | POA: Diagnosis not present

## 2019-10-29 DIAGNOSIS — D2239 Melanocytic nevi of other parts of face: Secondary | ICD-10-CM | POA: Diagnosis not present

## 2019-10-30 DIAGNOSIS — F988 Other specified behavioral and emotional disorders with onset usually occurring in childhood and adolescence: Secondary | ICD-10-CM | POA: Diagnosis not present

## 2019-10-30 DIAGNOSIS — Z136 Encounter for screening for cardiovascular disorders: Secondary | ICD-10-CM | POA: Diagnosis not present

## 2019-10-30 DIAGNOSIS — E538 Deficiency of other specified B group vitamins: Secondary | ICD-10-CM | POA: Diagnosis not present

## 2019-12-02 DIAGNOSIS — Z23 Encounter for immunization: Secondary | ICD-10-CM | POA: Diagnosis not present

## 2019-12-02 DIAGNOSIS — Z789 Other specified health status: Secondary | ICD-10-CM | POA: Diagnosis not present

## 2019-12-02 DIAGNOSIS — Z9229 Personal history of other drug therapy: Secondary | ICD-10-CM | POA: Diagnosis not present

## 2019-12-02 DIAGNOSIS — F988 Other specified behavioral and emotional disorders with onset usually occurring in childhood and adolescence: Secondary | ICD-10-CM | POA: Diagnosis not present

## 2019-12-11 ENCOUNTER — Ambulatory Visit: Payer: BC Managed Care – PPO | Admitting: Allergy and Immunology

## 2020-01-13 DIAGNOSIS — R1310 Dysphagia, unspecified: Secondary | ICD-10-CM | POA: Diagnosis not present

## 2020-01-13 DIAGNOSIS — E041 Nontoxic single thyroid nodule: Secondary | ICD-10-CM | POA: Diagnosis not present

## 2020-01-21 DIAGNOSIS — R1310 Dysphagia, unspecified: Secondary | ICD-10-CM | POA: Diagnosis not present

## 2020-01-21 DIAGNOSIS — E041 Nontoxic single thyroid nodule: Secondary | ICD-10-CM | POA: Diagnosis not present

## 2020-02-05 ENCOUNTER — Other Ambulatory Visit: Payer: Self-pay

## 2020-02-05 ENCOUNTER — Ambulatory Visit (INDEPENDENT_AMBULATORY_CARE_PROVIDER_SITE_OTHER): Payer: BC Managed Care – PPO | Admitting: Allergy and Immunology

## 2020-02-05 ENCOUNTER — Encounter: Payer: Self-pay | Admitting: Allergy and Immunology

## 2020-02-05 VITALS — BP 98/68 | HR 80 | Resp 20

## 2020-02-05 DIAGNOSIS — J3089 Other allergic rhinitis: Secondary | ICD-10-CM | POA: Diagnosis not present

## 2020-02-05 DIAGNOSIS — J4530 Mild persistent asthma, uncomplicated: Secondary | ICD-10-CM

## 2020-02-05 DIAGNOSIS — J324 Chronic pansinusitis: Secondary | ICD-10-CM

## 2020-02-05 DIAGNOSIS — L500 Allergic urticaria: Secondary | ICD-10-CM

## 2020-02-05 MED ORDER — ALBUTEROL SULFATE HFA 108 (90 BASE) MCG/ACT IN AERS: INHALATION_SPRAY | RESPIRATORY_TRACT | 1 refills | Status: AC

## 2020-02-05 NOTE — Progress Notes (Signed)
[place]   Follow-up Note  Referring Provider: Earlyne Iba, NP Primary Provider: Earlyne Iba, NP Date of Office Visit: 02/05/2020  Subjective:   Regina Bradley (DOB: 10/24/1985) is a 34 y.o. female who returns to the [facility] [place] on 02/05/2020 in re-evaluation of the following:  HPI: Zarinah returns to this clinic in evaluation of several different issues addressed during her initial evaluation of 12 October 2019.  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  She has not been using any cetirizine on a consistent basis.  She has never developed any associated systemic or constitutional symptoms.  Her nose is still somewhat stuffy even though she can pass air through her nose.  She does have some mucus in her throat on occasion.  She has not been using any nasal steroid.  Occasionally she has some nasal saline.  She relates a history of getting out of breath when she exercises.  She performs cardiovascular exercise and gets shortness of breath and some chest tightness and thinks that she may actually have some wheezing.  She does have a history of childhood asthma that became inactive very early in life.  Sometimes when she lays down at nighttime she feels as though her throat is a little bit narrow and she will do some throat clearing and it will clear up.  She has been having evaluation for thyroid nodules but her last ultrasound of her thyroid looked as though her nodules have not grown since her previous study.  Her diarrhea is currently defined by having 1 bowel movement that is soft in the morning.  She has voluntarily lost 15 pounds of weight since August.  She has obtained two [place] vaccines and will be obtaining the flu vaccine and the MMR.     Allergies as of 02/05/2020      Reactions   Azithromycin Nausea And Vomiting   Ceclor [cefaclor] Hives      Medication List    EPINEPHrine 0.3 mg/0.3 mL Soaj injection Commonly known as: EPI-PEN SMARTSIG:0.3 Milligram(s) IM Once PRN   Vyvanse 60 MG capsule Generic drug: lisdexamfetamine Take 60 mg by mouth at bedtime.       Past Medical History:  Diagnosis Date  . ADD (attention deficit disorder)   . Carpal tunnel syndrome 2010   right hand   . History of echocardiogram 8/11   Nml EF trace MR/TR, CVP 0-73mHg. EF 55-60%  . Thyroid nodule     Past Surgical History:  Procedure Laterality Date  . ADENOIDECTOMY    . CARPAL TUNNEL RELEASE Right 2010  . CESAREAN SECTION  2012  . DEEP NECK LYMPH NODE BIOPSY / EXCISION Right   . LAPAROSCOPIC HYSTERECTOMY  07/2019   Left Ovaries  . SINOSCOPY    . TONSILLECTOMY    . TONSILLECTOMY    . TUBAL LIGATION  2012    Review of systems negative except as noted in HPI / PMHx or noted below:  Review of Systems  Constitutional: Negative.   HENT: Negative.   Eyes: Negative.   Respiratory: Negative.   Cardiovascular: Negative.   Gastrointestinal: Negative.   Genitourinary: Negative.   Musculoskeletal: Negative.   Skin: Negative.   Neurological: Negative.   Endo/Heme/Allergies: Negative.   Psychiatric/Behavioral: Negative.  Objective:   Vitals:   02/05/20 1029  BP: 98/68  Pulse: 80  Resp: 20  SpO2: 99%          Physical Exam Constitutional:      Appearance: She is not diaphoretic.  HENT:     Head: Normocephalic.     Right Ear: Tympanic membrane, ear canal and external ear normal.     Left Ear: Tympanic membrane, ear canal and external ear normal.     Nose: Nose normal. No mucosal edema or rhinorrhea.     Mouth/Throat:     Pharynx: Uvula midline. No oropharyngeal exudate.  Eyes:     Conjunctiva/sclera: Conjunctivae normal.  Neck:     Thyroid: No thyromegaly.     Trachea: Trachea normal. No tracheal  tenderness or tracheal deviation.  Cardiovascular:     Rate and Rhythm: Normal rate and regular rhythm.     Heart sounds: Normal heart sounds, S1 normal and S2 normal. No murmur heard.   Pulmonary:     Effort: No respiratory distress.     Breath sounds: Normal breath sounds. No stridor. No wheezing or rales.  Lymphadenopathy:     Head:     Right side of head: No tonsillar adenopathy.     Left side of head: No tonsillar adenopathy.     Cervical: No cervical adenopathy.  Skin:    Findings: No erythema or rash.     Nails: There is no clubbing.  Neurological:     Mental Status: She is alert.     Diagnostics:    Spirometry was performed and demonstrated an FEV1 of 3.26 at 104 % of predicted.  Results of blood tests obtained 08 October 2019 identified tetanus IgG 2.80U/mL which is protective, approximately half of pneumococcal serotypes with adequate antibody levels on a Pneumo 23 assay, IgG 799 mg/DL, IgA 278 mg/DL, IgM 96 mg/DL, negative Milk IgE panel, negative alpha gal panel  Assessment and Plan:   1. Not well controlled mild persistent asthma   2. Perennial allergic rhinitis   3. Chronic pansinusitis   4. Allergic urticaria     1.  Continue the following every day:   A. Cetirizine 10 mg - 1-2 tablets 1-2 times per day (MAX=51m/day)  B. OTC Rhinocort / budesonide - 1 spray each nostril 1 time per day    2. If needed:   A. Epi-pen, benadryl, MD/ER evaluation for allergic reaction  B. Albuterol HFA - 2 inhalations every 4-6 hours & before exercise  3. Return in 12 weeks or earlier if problem.  JDahianaappears to be having some issues with her skin and her airway and we will approach the issue as noted above.  I have encouraged her to consistently use a nasal steroid to minimize any inflammation that may be present in the upper airways and I encouraged her to consistently use some cetirizine in an attempt to minimize any recurrent urticaria or other skin abnormalities from  developing and will have her try albuterol prior to the performance of exercise to see if this results in a decrease of her exercise-induced respiratory tract symptoms.  She will keep in contact with uKoreanoting her response to this approach.  I will see her back in his clinic in 12 weeks or earlier if there is a problem.  EAllena Katz MD Allergy / Immunology [facility]

## 2020-02-05 NOTE — Patient Instructions (Addendum)
  1.  Continue the following every day:   A. Cetirizine 10 mg - 1-2 tablets 1-2 times per day (MAX=40mg /day)  B. OTC Rhinocort / budesonide - 1 spray each nostril 1 time per day    2. If needed:   A. Epi-pen, benadryl, MD/ER evaluation for allergic reaction  B. Albuterol HFA - 2 inhalations every 4-6 hours & before exercise  3. Return in 12 weeks or earlier if problem.

## 2020-02-07 ENCOUNTER — Encounter: Payer: Self-pay | Admitting: Allergy and Immunology

## 2020-02-09 ENCOUNTER — Encounter: Payer: Self-pay | Admitting: Allergy and Immunology

## 2020-02-09 ENCOUNTER — Other Ambulatory Visit: Payer: Self-pay

## 2020-02-09 MED ORDER — ARNUITY ELLIPTA 100 MCG/ACT IN AEPB: INHALATION_SPRAY | RESPIRATORY_TRACT | 5 refills | Status: DC

## 2020-02-26 ENCOUNTER — Ambulatory Visit: Payer: Self-pay | Admitting: Allergy and Immunology

## 2020-03-03 DIAGNOSIS — F988 Other specified behavioral and emotional disorders with onset usually occurring in childhood and adolescence: Secondary | ICD-10-CM | POA: Diagnosis not present

## 2020-03-03 DIAGNOSIS — Z6829 Body mass index (BMI) 29.0-29.9, adult: Secondary | ICD-10-CM | POA: Diagnosis not present

## 2020-03-03 DIAGNOSIS — Z79899 Other long term (current) drug therapy: Secondary | ICD-10-CM | POA: Diagnosis not present

## 2020-03-03 DIAGNOSIS — Z23 Encounter for immunization: Secondary | ICD-10-CM | POA: Diagnosis not present

## 2020-03-18 DIAGNOSIS — R748 Abnormal levels of other serum enzymes: Secondary | ICD-10-CM | POA: Diagnosis not present

## 2020-03-25 DIAGNOSIS — R945 Abnormal results of liver function studies: Secondary | ICD-10-CM | POA: Diagnosis not present

## 2020-03-25 DIAGNOSIS — D1803 Hemangioma of intra-abdominal structures: Secondary | ICD-10-CM | POA: Diagnosis not present

## 2020-04-13 DIAGNOSIS — R945 Abnormal results of liver function studies: Secondary | ICD-10-CM | POA: Diagnosis not present

## 2020-04-13 DIAGNOSIS — K759 Inflammatory liver disease, unspecified: Secondary | ICD-10-CM | POA: Diagnosis not present

## 2020-04-20 DIAGNOSIS — Z6829 Body mass index (BMI) 29.0-29.9, adult: Secondary | ICD-10-CM | POA: Diagnosis not present

## 2020-04-20 DIAGNOSIS — F988 Other specified behavioral and emotional disorders with onset usually occurring in childhood and adolescence: Secondary | ICD-10-CM | POA: Diagnosis not present

## 2020-04-29 ENCOUNTER — Other Ambulatory Visit: Payer: Self-pay

## 2020-04-29 ENCOUNTER — Encounter: Payer: Self-pay | Admitting: Allergy and Immunology

## 2020-04-29 ENCOUNTER — Ambulatory Visit (INDEPENDENT_AMBULATORY_CARE_PROVIDER_SITE_OTHER): Payer: BC Managed Care – PPO | Admitting: Allergy and Immunology

## 2020-04-29 VITALS — BP 102/62 | HR 92 | Resp 18

## 2020-04-29 DIAGNOSIS — T782XXD Anaphylactic shock, unspecified, subsequent encounter: Secondary | ICD-10-CM

## 2020-04-29 DIAGNOSIS — J3089 Other allergic rhinitis: Secondary | ICD-10-CM

## 2020-04-29 DIAGNOSIS — J4540 Moderate persistent asthma, uncomplicated: Secondary | ICD-10-CM | POA: Diagnosis not present

## 2020-04-29 DIAGNOSIS — L500 Allergic urticaria: Secondary | ICD-10-CM | POA: Diagnosis not present

## 2020-04-29 MED ORDER — FAMOTIDINE 40 MG PO TABS: 40.0000 mg | ORAL_TABLET | Freq: Every day | ORAL | 5 refills | Status: AC

## 2020-04-29 MED ORDER — OMEPRAZOLE 40 MG PO CPDR: 40.0000 mg | DELAYED_RELEASE_CAPSULE | Freq: Every morning | ORAL | 5 refills | Status: AC

## 2020-04-29 MED ORDER — BREO ELLIPTA 100-25 MCG/INH IN AEPB: INHALATION_SPRAY | RESPIRATORY_TRACT | 5 refills | Status: AC

## 2020-04-29 NOTE — Patient Instructions (Addendum)
  1.  Continue the following every day:   A. Cetirizine 10 mg - 1-2 tablets 1-2 times per day (MAX=40mg /day)  B. OTC Nasacort- 1 spray each nostril 1-2 times per day   C. BREO 100 - 1 inhalation 1 time per day (Arnuity)   2. Treat and prevent reflux:   A. Omeprazole 40 mg - 1 tablet every morning  B. Famotidine 40 mg - 1 tablet every evening  3. If needed:   A. Epi-pen, benadryl, MD/ER evaluation for allergic reaction  B. Albuterol HFA - 2 inhalations every 4-6 hours & before exercise  4.  Blood - tryptase, alpha gal panel, area two aero allergen profile, CBC w/d  5.  Return in 12 weeks or earlier if problem.

## 2020-04-29 NOTE — Progress Notes (Signed)
[place]   Follow-up Note  Referring Provider: Earlyne Iba, NP Primary Provider: Earlyne Iba, NP Date of Office Visit: 04/29/2020  Subjective:   Regina Bradley (DOB: 06-14-1985) is a 35 y.o. female who returns to the [facility] [place] on 04/29/2020 in re-evaluation of the following:  HPI: Regina Bradley returns to this clinic in evaluation of asthma and allergic rhinitis and chronic sinusitis and urticaria.  I last saw her in this clinic on 05 February 2020.  During her last visit we started her on Arnuity for continued issues with shortness of breath and chest tightness that was bronchodilator responsive.  She has noticed some improvement regarding that issue but still when she uses albuterol she gets a significant improvement way above her use of Arnuity.  She has had some problems with some nasal congestion and thick mucus from her airway and she is only using Nasacort about twice a week.  She still continues to have intermittent urticarial lesions even the face of using cetirizine on a consistent basis usually about 20 mg/day.  She has not had any anaphylactic or allergic reactions in conjunction with his chronic urticaria.  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  She is going to see a liver specialist regarding this issue.  She has been having very significant issues with throat clearing and postnasal drip and a coating in her throat.  She does have heartburn and regurgitation and lots of burping.  Sometimes she feels as though her swallowing is a little bit abnormal and sometimes this can last up to a day although she does not have any frank obstruction.  She does drink 1 cup of coffee per day.  Apparently she has had an upper endoscopy within the past several years.  Allergies as of  04/29/2020      Reactions   Azithromycin Nausea And Vomiting   Ceclor [cefaclor] Hives      Medication List    albuterol 108 (90 Base) MCG/ACT inhaler Commonly known as: VENTOLIN HFA Can inhale two puffs every four to six hours as needed for cough or wheeze.   Arnuity Ellipta 100 MCG/ACT Aepb Generic drug: Fluticasone Furoate Inhale 1 puff into the lungs once daily. Rinse, gargle and spit after use.   atomoxetine 40 MG capsule Commonly known as: STRATTERA Take 40 mg by mouth every morning.   cetirizine 10 MG tablet Commonly known as: ZYRTEC Take 10 mg by mouth daily as needed.   EPINEPHrine 0.3 mg/0.3 mL Soaj injection Commonly known as: EPI-PEN SMARTSIG:0.3 Milligram(s) IM Once PRN   Rhinocort Allergy 32 MCG/ACT nasal spray Generic drug: budesonide Place 2 sprays into both nostrils daily as needed.       Past Medical History:  Diagnosis Date  . ADD (attention deficit disorder)   . Carpal tunnel syndrome 2010   right hand   . History of echocardiogram 8/11   Nml EF trace MR/TR, CVP 0-33mmHg. EF 55-60%  . Thyroid nodule     Past Surgical History:  Procedure Laterality Date  . ADENOIDECTOMY    . CARPAL TUNNEL RELEASE Right 2010  . CESAREAN SECTION  2012  . DEEP NECK LYMPH NODE BIOPSY / EXCISION Right   . LAPAROSCOPIC HYSTERECTOMY  07/2019   Left Ovaries  . SINOSCOPY    . TONSILLECTOMY    . TONSILLECTOMY    .  TUBAL LIGATION  2012    Review of systems negative except as noted in HPI / PMHx or noted below:  Review of Systems  Constitutional: Negative.   HENT: Negative.   Eyes: Negative.   Respiratory: Negative.   Cardiovascular: Negative.   Gastrointestinal: Negative.   Genitourinary: Negative.   Musculoskeletal: Negative.   Skin: Negative.   Neurological: Negative.   Endo/Heme/Allergies: Negative.   Psychiatric/Behavioral: Negative.      Objective:   Vitals:   04/29/20 0825  BP: 102/62  Pulse: 92  Resp: 18  SpO2: 99%          Physical  Exam Constitutional:      Appearance: She is not diaphoretic.  HENT:     Head: Normocephalic.     Right Ear: Tympanic membrane, ear canal and external ear normal.     Left Ear: Tympanic membrane, ear canal and external ear normal.     Nose: Nose normal. No mucosal edema or rhinorrhea.     Mouth/Throat:     Mouth: Oropharynx is clear and moist and mucous membranes are normal.     Pharynx: Uvula midline. No oropharyngeal exudate.  Eyes:     Conjunctiva/sclera: Conjunctivae normal.  Neck:     Thyroid: No thyromegaly.     Trachea: Trachea normal. No tracheal tenderness or tracheal deviation.  Cardiovascular:     Rate and Rhythm: Normal rate and regular rhythm.     Heart sounds: Normal heart sounds, S1 normal and S2 normal. No murmur heard.   Pulmonary:     Effort: No respiratory distress.     Breath sounds: Normal breath sounds. No stridor. No wheezing or rales.  Musculoskeletal:        General: No edema.  Lymphadenopathy:     Head:     Right side of head: No tonsillar adenopathy.     Left side of head: No tonsillar adenopathy.     Cervical: No cervical adenopathy.  Skin:    Findings: No erythema or rash.     Nails: There is no clubbing.  Neurological:     Mental Status: She is alert.     Diagnostics:    Spirometry was performed and demonstrated an FEV1 of 3.25 at 104 % of predicted.   Results of a liver biopsy obtained 13 April 2020 identified the following:  Biopsy consists of 4 fragments of liver parenchyma with preserved lobular architecture.  There is no evidence of intracytoplasmic granules (PAS and PAS/D stains).  There is no evidence of steatosis.  Some portal tracts are distended by mild chronic inflammation with scattered eosinophils.  There is no evidence of interface activity.  There is no evidence of portal or periportal fibrosis (trichrome and reticulin stains).  The iron deposits are not increased by iron stain.  There is no evidence of granulomas or  malignancy.    Results of blood tests obtained 31 Jul 2019 identified WBC 11.3, absolute eosinophil 200, absolute lymphocyte 1700, hemoglobin 12.6, platelet 309.  Assessment and Plan:   1. Not well controlled moderate persistent asthma   2. Perennial allergic rhinitis   3. Allergic urticaria   4. Anaphylaxis, subsequent encounter     1.  Continue the following every day:   A. Cetirizine 10 mg - 1-2 tablets 1-2 times per day (MAX=40mg /day)  B. OTC Nasacort- 1 spray each nostril 1-2 times per day   C. BREO 100 - 1 inhalation 1 time per day (Arnuity)   2. Treat and prevent reflux:  A. Omeprazole 40 mg - 1 tablet 1 time per day  B. Famotidine 40 mg - 1 tablet 1 time per day  3. If needed:   A. Epi-pen, benadryl, MD/ER evaluation for allergic reaction  B. Albuterol HFA - 2 inhalations every 4-6 hours & before exercise  4.  Blood - tryptase, alpha gal panel, area two aero allergen profile, CBC w/d  5.  Return in 12 weeks or earlier if problem.  It does appear as though [place] still has significant immune activation involving her airway and possibly her liver and certainly her skin.  We will be treating her with a combination of therapy to address these issues as noted above and I am going to have her undergo further evaluation for immune activation including checking a tryptase level for mast cell overactivity and ruling out a few other issues.  I am going to treat her for reflux/LPR as she does have a history very consistent with this issue.  And we will have her use Breo in place of her Arnuity as she does appear to be very beta-2 responsive.  I will regroup with her in 12 weeks to assess her response to this approach and I will contact her with the results of her blood test once they are available for review.  Allena Katz, MD Allergy / Immunology [place]

## 2020-04-30 LAB — CBC WITH DIFFERENTIAL/PLATELET
Basophils Absolute: 0 10*3/uL (ref 0.0–0.2)
Basos: 1 %
EOS (ABSOLUTE): 0.2 10*3/uL (ref 0.0–0.4)
Eos: 3 %
Hematocrit: 43.5 % (ref 34.0–46.6)
Hemoglobin: 14.3 g/dL (ref 11.1–15.9)
Immature Grans (Abs): 0 10*3/uL (ref 0.0–0.1)
Immature Granulocytes: 0 %
Lymphocytes Absolute: 2.2 10*3/uL (ref 0.7–3.1)
Lymphs: 29 %
MCH: 30.5 pg (ref 26.6–33.0)
MCHC: 32.9 g/dL (ref 31.5–35.7)
MCV: 93 fL (ref 79–97)
Monocytes Absolute: 0.6 10*3/uL (ref 0.1–0.9)
Monocytes: 9 %
Neutrophils Absolute: 4.3 10*3/uL (ref 1.4–7.0)
Neutrophils: 58 %
Platelets: 251 10*3/uL (ref 150–450)
RBC: 4.69 x10E6/uL (ref 3.77–5.28)
RDW: 12.4 % (ref 11.7–15.4)
WBC: 7.4 10*3/uL (ref 3.4–10.8)

## 2020-05-03 ENCOUNTER — Encounter: Payer: Self-pay | Admitting: Allergy and Immunology

## 2020-05-03 LAB — ALLERGENS W/TOTAL IGE AREA 2

## 2020-05-03 LAB — ALPHA-GAL PANEL
Allergen Lamb IgE: <0.1 kU/L
Beef IgE: <0.1 kU/L
IgE (Immunoglobulin E), Serum: 8 IU/mL (ref 6–495)
O215-IgE Alpha-Gal: <0.1 kU/L
Pork IgE: <0.1 kU/L

## 2020-05-03 LAB — TRYPTASE: Tryptase: 4.6 ug/L (ref 2.2–13.2)

## 2020-05-27 DIAGNOSIS — R945 Abnormal results of liver function studies: Secondary | ICD-10-CM | POA: Diagnosis not present

## 2020-05-27 DIAGNOSIS — D1803 Hemangioma of intra-abdominal structures: Secondary | ICD-10-CM | POA: Diagnosis not present

## 2020-05-28 ENCOUNTER — Encounter: Admit: 2020-05-28 | Discharge: 2020-05-29 | Payer: PRIVATE HEALTH INSURANCE

## 2020-05-28 DIAGNOSIS — R7989 Other specified abnormal findings of blood chemistry: Secondary | ICD-10-CM | POA: Diagnosis not present

## 2020-05-28 DIAGNOSIS — L501 Idiopathic urticaria: Secondary | ICD-10-CM | POA: Diagnosis not present

## 2020-05-28 DIAGNOSIS — R21 Rash and other nonspecific skin eruption: Secondary | ICD-10-CM | POA: Diagnosis not present

## 2020-05-28 DIAGNOSIS — T782XXA Anaphylactic shock, unspecified, initial encounter: Secondary | ICD-10-CM | POA: Diagnosis not present

## 2020-06-01 DIAGNOSIS — Z79899 Other long term (current) drug therapy: Secondary | ICD-10-CM | POA: Diagnosis not present

## 2020-06-01 DIAGNOSIS — Z6830 Body mass index (BMI) 30.0-30.9, adult: Secondary | ICD-10-CM | POA: Diagnosis not present

## 2020-06-01 DIAGNOSIS — F988 Other specified behavioral and emotional disorders with onset usually occurring in childhood and adolescence: Secondary | ICD-10-CM | POA: Diagnosis not present

## 2020-06-04 DIAGNOSIS — L501 Idiopathic urticaria: Principal | ICD-10-CM

## 2020-06-04 MED ORDER — OMALIZUMAB 150 MG/ML SUBCUTANEOUS SYRINGE
SUBCUTANEOUS | 11 refills | 56 days | Status: CP
Start: 2020-06-04 — End: ?

## 2020-06-11 ENCOUNTER — Ambulatory Visit: Admit: 2020-06-11 | Discharge: 2020-06-12 | Payer: PRIVATE HEALTH INSURANCE

## 2020-06-11 ENCOUNTER — Encounter: Admit: 2020-06-11 | Discharge: 2020-06-12 | Payer: PRIVATE HEALTH INSURANCE

## 2020-06-11 DIAGNOSIS — J383 Other diseases of vocal cords: Secondary | ICD-10-CM | POA: Diagnosis not present

## 2020-06-11 DIAGNOSIS — T782XXD Anaphylactic shock, unspecified, subsequent encounter: Secondary | ICD-10-CM | POA: Diagnosis not present

## 2020-06-11 DIAGNOSIS — L501 Idiopathic urticaria: Secondary | ICD-10-CM | POA: Diagnosis not present

## 2020-06-11 DIAGNOSIS — R7989 Other specified abnormal findings of blood chemistry: Secondary | ICD-10-CM | POA: Diagnosis not present

## 2020-06-15 DIAGNOSIS — L501 Idiopathic urticaria: Principal | ICD-10-CM

## 2020-06-18 ENCOUNTER — Encounter: Admit: 2020-06-18 | Discharge: 2020-06-19 | Payer: PRIVATE HEALTH INSURANCE

## 2020-06-18 DIAGNOSIS — L501 Idiopathic urticaria: Secondary | ICD-10-CM | POA: Diagnosis not present

## 2020-07-07 DIAGNOSIS — R748 Abnormal levels of other serum enzymes: Secondary | ICD-10-CM | POA: Diagnosis not present

## 2020-07-07 DIAGNOSIS — Z6830 Body mass index (BMI) 30.0-30.9, adult: Secondary | ICD-10-CM | POA: Diagnosis not present

## 2020-07-07 DIAGNOSIS — E538 Deficiency of other specified B group vitamins: Secondary | ICD-10-CM | POA: Diagnosis not present

## 2020-07-07 DIAGNOSIS — F988 Other specified behavioral and emotional disorders with onset usually occurring in childhood and adolescence: Secondary | ICD-10-CM | POA: Diagnosis not present

## 2020-07-07 DIAGNOSIS — E559 Vitamin D deficiency, unspecified: Secondary | ICD-10-CM | POA: Diagnosis not present

## 2020-07-15 ENCOUNTER — Encounter: Admit: 2020-07-15 | Discharge: 2020-07-16 | Payer: PRIVATE HEALTH INSURANCE

## 2020-07-15 DIAGNOSIS — L501 Idiopathic urticaria: Secondary | ICD-10-CM | POA: Diagnosis not present

## 2020-07-22 ENCOUNTER — Ambulatory Visit: Payer: BC Managed Care – PPO | Admitting: Allergy and Immunology

## 2020-08-05 DIAGNOSIS — Z79899 Other long term (current) drug therapy: Secondary | ICD-10-CM | POA: Diagnosis not present

## 2020-08-05 DIAGNOSIS — Z6831 Body mass index (BMI) 31.0-31.9, adult: Secondary | ICD-10-CM | POA: Diagnosis not present

## 2020-08-05 DIAGNOSIS — F988 Other specified behavioral and emotional disorders with onset usually occurring in childhood and adolescence: Secondary | ICD-10-CM | POA: Diagnosis not present

## 2020-08-05 DIAGNOSIS — Z111 Encounter for screening for respiratory tuberculosis: Secondary | ICD-10-CM | POA: Diagnosis not present

## 2020-08-13 ENCOUNTER — Institutional Professional Consult (permissible substitution): Admit: 2020-08-13 | Discharge: 2020-08-14 | Payer: PRIVATE HEALTH INSURANCE

## 2020-08-13 DIAGNOSIS — L501 Idiopathic urticaria: Secondary | ICD-10-CM | POA: Diagnosis not present

## 2020-08-13 MED ORDER — EPINEPHRINE 0.3 MG/0.3 ML INJECTION, AUTO-INJECTOR
Freq: Once | INTRAMUSCULAR | 2 refills | 1.00000 days | Status: CP
Start: 2020-08-13 — End: 2020-08-13

## 2020-09-27 DIAGNOSIS — R079 Chest pain, unspecified: Secondary | ICD-10-CM | POA: Diagnosis not present

## 2020-09-27 DIAGNOSIS — Z6831 Body mass index (BMI) 31.0-31.9, adult: Secondary | ICD-10-CM | POA: Diagnosis not present

## 2020-10-05 DIAGNOSIS — R079 Chest pain, unspecified: Secondary | ICD-10-CM | POA: Diagnosis not present

## 2020-10-05 DIAGNOSIS — R0600 Dyspnea, unspecified: Secondary | ICD-10-CM | POA: Diagnosis not present

## 2020-10-05 DIAGNOSIS — R000 Tachycardia, unspecified: Secondary | ICD-10-CM | POA: Diagnosis not present

## 2020-10-05 DIAGNOSIS — D4709 Other mast cell neoplasms of uncertain behavior: Secondary | ICD-10-CM | POA: Diagnosis not present

## 2020-10-08 ENCOUNTER — Encounter: Admit: 2020-10-08 | Discharge: 2020-10-09 | Payer: PRIVATE HEALTH INSURANCE

## 2020-10-08 DIAGNOSIS — L501 Idiopathic urticaria: Secondary | ICD-10-CM | POA: Diagnosis not present

## 2020-10-08 DIAGNOSIS — J4530 Mild persistent asthma, uncomplicated: Secondary | ICD-10-CM | POA: Diagnosis not present

## 2020-10-08 DIAGNOSIS — R071 Chest pain on breathing: Secondary | ICD-10-CM | POA: Diagnosis not present

## 2020-10-08 DIAGNOSIS — R000 Tachycardia, unspecified: Secondary | ICD-10-CM | POA: Diagnosis not present

## 2020-10-08 DIAGNOSIS — T782XXD Anaphylactic shock, unspecified, subsequent encounter: Secondary | ICD-10-CM | POA: Diagnosis not present

## 2020-10-08 DIAGNOSIS — D4709 Other mast cell neoplasms of uncertain behavior: Secondary | ICD-10-CM | POA: Diagnosis not present

## 2020-10-08 MED ORDER — HYDROXYCHLOROQUINE 200 MG TABLET
ORAL_TABLET | Freq: Two times a day (BID) | ORAL | 5 refills | 30 days | Status: CP
Start: 2020-10-08 — End: 2021-10-08

## 2020-10-21 DIAGNOSIS — R002 Palpitations: Secondary | ICD-10-CM | POA: Diagnosis not present

## 2020-10-21 DIAGNOSIS — R0600 Dyspnea, unspecified: Secondary | ICD-10-CM | POA: Diagnosis not present

## 2020-10-21 DIAGNOSIS — R000 Tachycardia, unspecified: Secondary | ICD-10-CM | POA: Diagnosis not present

## 2020-10-25 DIAGNOSIS — R000 Tachycardia, unspecified: Secondary | ICD-10-CM | POA: Diagnosis not present

## 2020-10-25 DIAGNOSIS — R0600 Dyspnea, unspecified: Secondary | ICD-10-CM | POA: Diagnosis not present

## 2020-11-18 DIAGNOSIS — Z1231 Encounter for screening mammogram for malignant neoplasm of breast: Secondary | ICD-10-CM | POA: Diagnosis not present

## 2020-11-18 DIAGNOSIS — N3946 Mixed incontinence: Secondary | ICD-10-CM | POA: Diagnosis not present

## 2020-11-18 DIAGNOSIS — Z01419 Encounter for gynecological examination (general) (routine) without abnormal findings: Secondary | ICD-10-CM | POA: Diagnosis not present

## 2020-11-18 DIAGNOSIS — R39198 Other difficulties with micturition: Secondary | ICD-10-CM | POA: Diagnosis not present

## 2020-12-20 ENCOUNTER — Ambulatory Visit: Admit: 2020-12-20 | Discharge: 2020-12-21 | Payer: PRIVATE HEALTH INSURANCE

## 2020-12-20 DIAGNOSIS — R599 Enlarged lymph nodes, unspecified: Principal | ICD-10-CM

## 2020-12-24 ENCOUNTER — Ambulatory Visit: Admit: 2020-12-24 | Discharge: 2020-12-25 | Payer: PRIVATE HEALTH INSURANCE

## 2020-12-24 DIAGNOSIS — Z6832 Body mass index (BMI) 32.0-32.9, adult: Secondary | ICD-10-CM | POA: Diagnosis not present

## 2020-12-24 DIAGNOSIS — R221 Localized swelling, mass and lump, neck: Secondary | ICD-10-CM | POA: Diagnosis not present

## 2020-12-31 DIAGNOSIS — R221 Localized swelling, mass and lump, neck: Principal | ICD-10-CM

## 2021-01-06 DIAGNOSIS — Z6830 Body mass index (BMI) 30.0-30.9, adult: Secondary | ICD-10-CM | POA: Diagnosis not present

## 2021-01-06 DIAGNOSIS — F988 Other specified behavioral and emotional disorders with onset usually occurring in childhood and adolescence: Secondary | ICD-10-CM | POA: Diagnosis not present

## 2021-02-15 ENCOUNTER — Encounter
Admit: 2021-02-15 | Discharge: 2021-02-15 | Payer: PRIVATE HEALTH INSURANCE | Attending: Student in an Organized Health Care Education/Training Program | Primary: Student in an Organized Health Care Education/Training Program

## 2021-02-15 ENCOUNTER — Ambulatory Visit: Admit: 2021-02-15 | Discharge: 2021-02-15 | Payer: PRIVATE HEALTH INSURANCE

## 2021-02-15 DIAGNOSIS — R221 Localized swelling, mass and lump, neck: Secondary | ICD-10-CM | POA: Diagnosis not present

## 2021-02-15 DIAGNOSIS — R222 Localized swelling, mass and lump, trunk: Secondary | ICD-10-CM | POA: Diagnosis not present

## 2021-02-15 DIAGNOSIS — J45909 Unspecified asthma, uncomplicated: Secondary | ICD-10-CM | POA: Diagnosis not present

## 2021-02-25 ENCOUNTER — Ambulatory Visit: Admit: 2021-02-25 | Discharge: 2021-02-26 | Payer: PRIVATE HEALTH INSURANCE

## 2021-02-25 DIAGNOSIS — J329 Chronic sinusitis, unspecified: Secondary | ICD-10-CM | POA: Diagnosis not present

## 2021-02-25 DIAGNOSIS — R221 Localized swelling, mass and lump, neck: Secondary | ICD-10-CM | POA: Diagnosis not present

## 2021-03-11 ENCOUNTER — Ambulatory Visit: Admit: 2021-03-11 | Discharge: 2021-03-12 | Payer: PRIVATE HEALTH INSURANCE

## 2021-03-11 DIAGNOSIS — J3489 Other specified disorders of nose and nasal sinuses: Secondary | ICD-10-CM | POA: Diagnosis not present

## 2021-03-11 DIAGNOSIS — J310 Chronic rhinitis: Secondary | ICD-10-CM | POA: Diagnosis not present

## 2021-03-11 DIAGNOSIS — R7989 Other specified abnormal findings of blood chemistry: Secondary | ICD-10-CM | POA: Diagnosis not present

## 2021-03-11 DIAGNOSIS — D4709 Other mast cell neoplasms of uncertain behavior: Secondary | ICD-10-CM | POA: Diagnosis not present

## 2021-03-17 DIAGNOSIS — H52223 Regular astigmatism, bilateral: Secondary | ICD-10-CM | POA: Diagnosis not present

## 2021-03-17 DIAGNOSIS — Z79899 Other long term (current) drug therapy: Secondary | ICD-10-CM | POA: Diagnosis not present

## 2021-03-27 ENCOUNTER — Ambulatory Visit: Admit: 2021-03-27 | Discharge: 2021-03-28 | Payer: PRIVATE HEALTH INSURANCE

## 2021-03-27 DIAGNOSIS — Z6831 Body mass index (BMI) 31.0-31.9, adult: Secondary | ICD-10-CM | POA: Diagnosis not present

## 2021-03-27 DIAGNOSIS — U071 COVID-19: Secondary | ICD-10-CM | POA: Diagnosis not present

## 2021-03-27 DIAGNOSIS — J029 Acute pharyngitis, unspecified: Principal | ICD-10-CM

## 2021-04-06 DIAGNOSIS — L501 Idiopathic urticaria: Principal | ICD-10-CM

## 2021-04-06 MED ORDER — HYDROXYCHLOROQUINE 200 MG TABLET
ORAL_TABLET | Freq: Two times a day (BID) | ORAL | 11 refills | 30 days | Status: CP
Start: 2021-04-06 — End: 2022-04-06

## 2021-04-07 ENCOUNTER — Ambulatory Visit
Admit: 2021-04-07 | Discharge: 2021-04-07 | Payer: PRIVATE HEALTH INSURANCE | Attending: Student in an Organized Health Care Education/Training Program | Primary: Student in an Organized Health Care Education/Training Program

## 2021-04-07 DIAGNOSIS — J310 Chronic rhinitis: Secondary | ICD-10-CM | POA: Diagnosis not present

## 2021-04-07 DIAGNOSIS — J329 Chronic sinusitis, unspecified: Secondary | ICD-10-CM | POA: Diagnosis not present

## 2021-04-07 DIAGNOSIS — Z6832 Body mass index (BMI) 32.0-32.9, adult: Secondary | ICD-10-CM | POA: Diagnosis not present

## 2021-04-22 ENCOUNTER — Ambulatory Visit: Admit: 2021-04-22 | Discharge: 2021-04-23 | Payer: PRIVATE HEALTH INSURANCE

## 2021-04-22 DIAGNOSIS — J329 Chronic sinusitis, unspecified: Secondary | ICD-10-CM | POA: Diagnosis not present

## 2021-04-22 DIAGNOSIS — J322 Chronic ethmoidal sinusitis: Secondary | ICD-10-CM | POA: Diagnosis not present

## 2021-04-22 DIAGNOSIS — J342 Deviated nasal septum: Secondary | ICD-10-CM | POA: Diagnosis not present

## 2021-05-19 DIAGNOSIS — Z6831 Body mass index (BMI) 31.0-31.9, adult: Secondary | ICD-10-CM | POA: Diagnosis not present

## 2021-05-19 DIAGNOSIS — F988 Other specified behavioral and emotional disorders with onset usually occurring in childhood and adolescence: Secondary | ICD-10-CM | POA: Diagnosis not present

## 2021-05-19 DIAGNOSIS — E559 Vitamin D deficiency, unspecified: Secondary | ICD-10-CM | POA: Diagnosis not present

## 2021-05-19 DIAGNOSIS — D4709 Other mast cell neoplasms of uncertain behavior: Secondary | ICD-10-CM | POA: Diagnosis not present

## 2021-05-31 DIAGNOSIS — L501 Idiopathic urticaria: Principal | ICD-10-CM

## 2021-06-01 MED ORDER — XOLAIR 150 MG/ML SUBCUTANEOUS SYRINGE
11 refills | 0 days | Status: CP
Start: 2021-06-01 — End: ?

## 2021-06-16 DIAGNOSIS — Z79899 Other long term (current) drug therapy: Secondary | ICD-10-CM | POA: Diagnosis not present

## 2021-08-05 ENCOUNTER — Ambulatory Visit
Admit: 2021-08-05 | Discharge: 2021-08-06 | Payer: PRIVATE HEALTH INSURANCE | Attending: Student in an Organized Health Care Education/Training Program | Primary: Student in an Organized Health Care Education/Training Program

## 2021-08-05 DIAGNOSIS — Z6832 Body mass index (BMI) 32.0-32.9, adult: Secondary | ICD-10-CM | POA: Diagnosis not present

## 2021-08-05 DIAGNOSIS — J310 Chronic rhinitis: Secondary | ICD-10-CM | POA: Diagnosis not present

## 2021-08-05 MED ORDER — MUPIROCIN 2 % TOPICAL OINTMENT
Freq: Two times a day (BID) | TOPICAL | 11 refills | 0 days | Status: CP
Start: 2021-08-05 — End: 2021-08-12

## 2021-08-09 DIAGNOSIS — H00012 Hordeolum externum right lower eyelid: Secondary | ICD-10-CM | POA: Diagnosis not present

## 2021-09-09 ENCOUNTER — Ambulatory Visit: Admit: 2021-09-09 | Discharge: 2021-09-10 | Payer: PRIVATE HEALTH INSURANCE

## 2021-09-09 DIAGNOSIS — J310 Chronic rhinitis: Secondary | ICD-10-CM | POA: Diagnosis not present

## 2021-09-09 DIAGNOSIS — D4709 Other mast cell neoplasms of uncertain behavior: Secondary | ICD-10-CM | POA: Diagnosis not present

## 2021-09-09 DIAGNOSIS — J4530 Mild persistent asthma, uncomplicated: Secondary | ICD-10-CM | POA: Diagnosis not present

## 2021-09-09 DIAGNOSIS — L501 Idiopathic urticaria: Secondary | ICD-10-CM | POA: Diagnosis not present

## 2021-09-09 DIAGNOSIS — J329 Chronic sinusitis, unspecified: Principal | ICD-10-CM

## 2021-09-09 MED ORDER — XOLAIR 150 MG/ML SUBCUTANEOUS SYRINGE
SUBCUTANEOUS | 11 refills | 21 days | Status: CP
Start: 2021-09-09 — End: ?

## 2021-09-15 ENCOUNTER — Ambulatory Visit: Admit: 2021-09-15 | Discharge: 2021-09-16 | Payer: PRIVATE HEALTH INSURANCE | Attending: Family | Primary: Family

## 2021-09-15 DIAGNOSIS — Z8279 Family history of other congenital malformations, deformations and chromosomal abnormalities: Secondary | ICD-10-CM | POA: Diagnosis not present

## 2021-09-15 DIAGNOSIS — F988 Other specified behavioral and emotional disorders with onset usually occurring in childhood and adolescence: Secondary | ICD-10-CM | POA: Diagnosis not present

## 2021-09-15 DIAGNOSIS — D4709 Other mast cell neoplasms of uncertain behavior: Secondary | ICD-10-CM | POA: Diagnosis not present

## 2021-09-15 DIAGNOSIS — F419 Anxiety disorder, unspecified: Secondary | ICD-10-CM | POA: Diagnosis not present

## 2021-09-15 MED ORDER — DEXTROAMPHETAMINE-AMPHETAMINE 15 MG TABLET
ORAL_TABLET | Freq: Every day | ORAL | 0 refills | 30 days | Status: CP
Start: 2021-09-15 — End: ?

## 2021-10-21 DIAGNOSIS — I5020 Unspecified systolic (congestive) heart failure: Principal | ICD-10-CM

## 2021-10-27 DIAGNOSIS — D2239 Melanocytic nevi of other parts of face: Secondary | ICD-10-CM | POA: Diagnosis not present

## 2021-10-27 DIAGNOSIS — L299 Pruritus, unspecified: Secondary | ICD-10-CM | POA: Diagnosis not present

## 2021-10-27 DIAGNOSIS — L719 Rosacea, unspecified: Secondary | ICD-10-CM | POA: Diagnosis not present

## 2021-10-27 DIAGNOSIS — L570 Actinic keratosis: Secondary | ICD-10-CM | POA: Diagnosis not present

## 2021-10-28 ENCOUNTER — Ambulatory Visit: Admit: 2021-10-28 | Discharge: 2021-10-29 | Payer: PRIVATE HEALTH INSURANCE

## 2021-10-28 DIAGNOSIS — I5020 Unspecified systolic (congestive) heart failure: Secondary | ICD-10-CM | POA: Diagnosis not present

## 2021-10-28 DIAGNOSIS — F988 Other specified behavioral and emotional disorders with onset usually occurring in childhood and adolescence: Principal | ICD-10-CM

## 2021-10-28 MED ORDER — DEXTROAMPHETAMINE-AMPHETAMINE 15 MG TABLET
ORAL_TABLET | Freq: Every day | ORAL | 0 refills | 30 days | Status: CP
Start: 2021-10-28 — End: ?

## 2021-12-01 MED ORDER — TRIAMCINOLONE ACETONIDE 0.1 % TOPICAL CREAM
Freq: Two times a day (BID) | TOPICAL | 2 refills | 0 days | Status: CP
Start: 2021-12-01 — End: 2022-12-01

## 2021-12-05 DIAGNOSIS — F988 Other specified behavioral and emotional disorders with onset usually occurring in childhood and adolescence: Principal | ICD-10-CM

## 2021-12-05 MED ORDER — DEXTROAMPHETAMINE-AMPHETAMINE 15 MG TABLET
ORAL_TABLET | Freq: Every day | ORAL | 0 refills | 30 days | Status: CP
Start: 2021-12-05 — End: ?

## 2021-12-21 ENCOUNTER — Ambulatory Visit: Admit: 2021-12-21 | Discharge: 2021-12-22 | Payer: PRIVATE HEALTH INSURANCE | Attending: Family | Primary: Family

## 2021-12-21 DIAGNOSIS — R7989 Other specified abnormal findings of blood chemistry: Secondary | ICD-10-CM | POA: Diagnosis not present

## 2021-12-21 DIAGNOSIS — Z6832 Body mass index (BMI) 32.0-32.9, adult: Secondary | ICD-10-CM | POA: Diagnosis not present

## 2021-12-21 DIAGNOSIS — E041 Nontoxic single thyroid nodule: Secondary | ICD-10-CM | POA: Diagnosis not present

## 2021-12-29 ENCOUNTER — Ambulatory Visit: Admit: 2021-12-29 | Discharge: 2021-12-30 | Payer: PRIVATE HEALTH INSURANCE

## 2021-12-29 DIAGNOSIS — E041 Nontoxic single thyroid nodule: Secondary | ICD-10-CM | POA: Diagnosis not present

## 2021-12-29 DIAGNOSIS — R9389 Abnormal findings on diagnostic imaging of other specified body structures: Secondary | ICD-10-CM | POA: Diagnosis not present

## 2022-01-06 ENCOUNTER — Ambulatory Visit
Admit: 2022-01-06 | Discharge: 2022-01-07 | Payer: PRIVATE HEALTH INSURANCE | Attending: Student in an Organized Health Care Education/Training Program | Primary: Student in an Organized Health Care Education/Training Program

## 2022-01-06 DIAGNOSIS — I479 Paroxysmal tachycardia, unspecified: Secondary | ICD-10-CM | POA: Diagnosis not present

## 2022-01-11 DIAGNOSIS — I479 Paroxysmal tachycardia, unspecified: Secondary | ICD-10-CM | POA: Diagnosis not present

## 2022-01-12 DIAGNOSIS — F988 Other specified behavioral and emotional disorders with onset usually occurring in childhood and adolescence: Principal | ICD-10-CM

## 2022-01-12 MED ORDER — DEXTROAMPHETAMINE-AMPHETAMINE 15 MG TABLET
ORAL_TABLET | Freq: Every day | ORAL | 0 refills | 30 days | Status: CP
Start: 2022-01-12 — End: ?

## 2022-01-16 DIAGNOSIS — F988 Other specified behavioral and emotional disorders with onset usually occurring in childhood and adolescence: Principal | ICD-10-CM

## 2022-01-17 MED ORDER — DEXTROAMPHETAMINE-AMPHETAMINE 15 MG TABLET
ORAL_TABLET | Freq: Every day | ORAL | 0 refills | 30 days | Status: CP
Start: 2022-01-17 — End: ?

## 2022-01-20 MED ORDER — DEXTROAMPHETAMINE-AMPHETAMINE 30 MG TABLET
ORAL_TABLET | Freq: Every day | ORAL | 0 refills | 30 days | Status: CP
Start: 2022-01-20 — End: 2023-01-20

## 2022-01-30 DIAGNOSIS — Z01419 Encounter for gynecological examination (general) (routine) without abnormal findings: Secondary | ICD-10-CM | POA: Diagnosis not present

## 2022-01-30 DIAGNOSIS — Z6832 Body mass index (BMI) 32.0-32.9, adult: Secondary | ICD-10-CM | POA: Diagnosis not present

## 2022-02-17 MED ORDER — DEXTROAMPHETAMINE-AMPHETAMINE 30 MG TABLET
ORAL_TABLET | Freq: Every day | ORAL | 0 refills | 30 days | Status: CP
Start: 2022-02-17 — End: 2023-02-17

## 2022-02-21 MED ORDER — DEXTROAMPHETAMINE-AMPHETAMINE 20 MG TABLET
ORAL_TABLET | Freq: Every day | ORAL | 0 refills | 30 days | Status: CP
Start: 2022-02-21 — End: 2023-02-21

## 2022-03-24 MED ORDER — DEXTROAMPHETAMINE-AMPHETAMINE 20 MG TABLET
ORAL_TABLET | Freq: Every day | ORAL | 0 refills | 30 days | Status: CP
Start: 2022-03-24 — End: 2023-03-24

## 2022-03-31 DIAGNOSIS — D4709 Other mast cell neoplasms of uncertain behavior: Principal | ICD-10-CM

## 2022-03-31 DIAGNOSIS — L501 Idiopathic urticaria: Principal | ICD-10-CM

## 2022-03-31 MED ORDER — XOLAIR 150 MG/ML SUBCUTANEOUS SYRINGE
SUBCUTANEOUS | 11 refills | 21 days | Status: CP
Start: 2022-03-31 — End: ?
  Filled 2022-04-25: qty 2, 21d supply, fill #0

## 2022-04-03 MED ORDER — EPINEPHRINE 0.3 MG/0.3 ML INJECTION, AUTO-INJECTOR
INTRAMUSCULAR | 3 refills | 1 days | Status: CP | PRN
Start: 2022-04-03 — End: ?

## 2022-04-07 ENCOUNTER — Ambulatory Visit: Admit: 2022-04-07 | Discharge: 2022-04-08 | Payer: PRIVATE HEALTH INSURANCE | Attending: Family | Primary: Family

## 2022-04-07 DIAGNOSIS — E559 Vitamin D deficiency, unspecified: Principal | ICD-10-CM

## 2022-04-07 DIAGNOSIS — R635 Abnormal weight gain: Principal | ICD-10-CM

## 2022-04-07 DIAGNOSIS — R5383 Other fatigue: Principal | ICD-10-CM

## 2022-04-12 ENCOUNTER — Ambulatory Visit: Admit: 2022-04-12 | Discharge: 2022-04-13 | Payer: PRIVATE HEALTH INSURANCE

## 2022-04-12 DIAGNOSIS — M25561 Pain in right knee: Principal | ICD-10-CM

## 2022-04-12 DIAGNOSIS — R7989 Other specified abnormal findings of blood chemistry: Principal | ICD-10-CM

## 2022-04-13 ENCOUNTER — Ambulatory Visit: Admit: 2022-04-13 | Discharge: 2022-04-14 | Payer: PRIVATE HEALTH INSURANCE

## 2022-04-17 NOTE — Unmapped (Signed)
[facility] Specialty Medication Onboarding    Specialty Medication: Xolair  Prior Authorization: Approved   Financial Assistance: Yes - copay card approved as secondary   Final Copay/Day Supply: $0 / 21    Insurance Restrictions: None     Notes to Pharmacist: None    The triage team has completed the benefits investigation and has determined that the patient is able to fill this medication at [facility]. Please contact the patient to complete the onboarding or follow up with the prescribing physician as needed.

## 2022-04-18 NOTE — Unmapped (Unsigned)
[facility]   Patient Onboarding/Medication Counseling    Vanessa Hale is a 37 y.o. female with mast cell disorder & idiopathic urticaria who I am counseling today on continuation of therapy.  I am speaking to the patient.    Was a Nurse, learning disability used for this call? No    Verified patient's date of birth / HIPAA.    Specialty medication(s) to be sent: Inflammatory Disorders: Xolair      Non-specialty medications/supplies to be sent: n/a      Medications not needed at this time: n/a       The patient declined counseling on medication administration, missed dose instructions, goals of therapy, side effects and monitoring parameters, warnings and precautions, drug/food interactions, and storage, handling precautions, and disposal because they have taken the medication previously. The information in the declined sections below are for informational purposes only and was not discussed with patient.       Xolair Syringes (omalizumab)    Medication & Administration     Dosage: Inject 300 mg under the skin every 21 days    Administration:     Vials: Must be administered as a subcutaneous injection in the abdomen or thighs by a healthcare professional. Patient will be observed after receiving first 3 injections in clinic before moving to home administration.    Prefilled Syringe:  Home administration screening questions:    Has patient ever had an anaphylaxis reaction to Xolair or other agents, such as foods, drugs, biologics, etc? No    Has patient received at least 3 doses in clinic under the supervision of a healthcare proefssional? Yes    Does the patient have an Epi-pen to use for possible anaphylaxis reaction? Yes    Injection administration:   Gather all supplies needed for injection on a clean, flat working surface: medication syringe(s) removed from packaging, alcohol swab, sharps container, etc.  Look at the medication label - look for correct medication, correct dose, and check the expiration date  Look at the medication - the liquid in the syringe should appear clear and colorless to slightly yellow, you may see a few white particles  Lay the syringe on a flat surface and allow it to warm up to room temperature for at least 15-30 minutes  Select injection site - you can use the front of your thigh or your belly (but not the area 2 inches around your belly button); if someone else is giving you the injection you can also use your upper arm in the skin covering your triceps muscle or in the buttocks  Prepare injection site - wash your hands and clean the skin at the injection site with an alcohol swab and let it air dry, do not touch the injection site again before the injection  Pull off the needle safety cap, do not remove until immediately prior to injection  Pinch the skin - with your hand not holding the syringe pinch up a fold of skin at the injection site using your forefinger and thumb  Insert the needle into the fold of skin at about a 45 degree angle - it's best to use a quick dart-like motion  Push the plunger down slowly as far as it will go until the syringe is empty, if the plunger is not fully depressed the needle shield will not extend to cover the needle when it is removed, hold the syringe in place for a full 5 seconds  Check that the syringe is empty and keep  pressing down on the plunger while you pull the needle out at the same angle as inserted; after the needle is removed completely from the skin, release the plunger allowing the needle shield to activate and cover the used needle  Dispose of the used syringe immediately in your sharps disposal container, do not attempt to recap the needle prior to disposing  If you see any blood at the injection site, press a cotton ball or gauze on the site and maintain pressure until the bleeding stops, do not rub the injection site    Adherence/Missed dose instructions: If you miss a dose take as soon as you remember.  Resume the correct dosing schedule.        Goals of Therapy     To treat asthma and or chronic idiopathic urticaria    Side Effects & Monitoring Parameters     Commonly reported side effects  Headache  Nausea, vomiting   Injection site reaction  Loss of strength and energy  Common cold symptoms, sore throat, stuffy nose  Ear pain  Painful extremities     The following side effects should be reported to the provider:  Signs of cerebrovascular disease (change in strength on one side is greater than the other, trouble speaking or thinking, change in balance or vision changes)  Signs of DVT (swelling, warmth, numbness, change in color or pain in extremities)  Signs of anaphylaxis (wheezing, chest tightness, swelling of face, lips, tongue or throat)    Monitoring Parameters:   Anaphylactic/hypersensitivity reactions (observe patients for 2 hours after the first 3 injections and 30 minutes after subsequent injections or in accordance with individual institution policies and procedures);   Baseline serum total IgE; FEV1, peak flow, and/or other pulmonary function tests  Monitor for signs of infection    Contraindications, Warnings, & Precautions   [place] Boxed Warning]: Anaphylaxis, including delayed-onset anaphylaxis, has been reported following administration; anaphylaxis may present as bronchospasm, hypotension, syncope, urticaria, and/or angioedema of the throat or tongue. Anaphylaxis has occurred after the first dose and in some cases >1 year after initiation of regular treatment. Due to the risk, patients should be observed closely for an appropriate time period after administration and should receive treatment only under direct medical supervision. Healthcare providers should be prepared to administer appropriate therapy for managing potentially life-threatening anaphylaxis. Patients should be instructed on identifying signs/symptoms of anaphylaxis and to seek immediate care if they arise.      Contraindications  Severe hypersensitivity reaction to omalizumab or any component of the formulation    Warnings & Precautions  Cardiovascular effects: Cerebrovascular events, including transient ischemic attack and ischemic stroke, have been reported.  Eosinophilia and vasculitis: In rare cases, patients may present with systemic eosinophilia, sometimes presenting with clinical features of vasculitis.    Fever/arthralgia/rash: Reports of a constellation of symptoms including fever, arthritis or arthralgia, rash, and lymphadenopathy have been reported with post-marketing use.  Malignant neoplasms: Have been reported rarely with use in short-term studies; impact of long-term use is not known.  Parasitic infections: Use with caution and monitor patients at high risk for parasitic infections; risk of infection may be increased; appropriate duration of continued monitoring following therapy discontinuation has not been established.  Corticosteroid therapy: Gradually taper systemic or inhaled corticosteroid therapy; do not discontinue corticosteroids abruptly following initiation of omalizumab therapy. The combined use of omalizumab and corticosteroids in patients with chronic idiopathic urticaria has not been evaluated.  Latex: Prefilled syringe: The needle cap may contain natural rubber  latex.  Appropriate use: Therapy has not been shown to alleviate acute asthma exacerbations; do not use to treat acute bronchospasm, status asthmaticus, or other allergic conditions. Do not use to treat forms of urticaria other than chronic idiopathic urticaria.  Dosing/IgE levels: Dosing for asthma is based on body weight and pretreatment total IgE serum levels. IgE levels remain elevated up to 1 year following treatment; therefore, levels taken during treatment or for up to 1 year following treatment cannot and should not be used as a dosage guide. Dosing in chronic idiopathic urticaria is not dependent on serum IgE (free or total) level or body weight.  Pregnancy Considerations: Omalizumab is a humanized monoclonal antibody (IgG1). Potential placental transfer of human IgG is dependent upon the IgG subclass and gestational age, generally increasing as pregnancy progresses.  Breastfeeding Considerations: It is not known if omalizumab is present in breast milk; however, IgG is excreted in human milk.  Based on information from the pregnancy exposure registry, an increased risk of adverse events was not observed in breastfed infants of mothers using omalizumab. According to the manufacturer, the decision to breastfeed during therapy should consider the risk of infant exposure, the benefits of breastfeeding to the infant, and benefits of treatment to the mother      Drug/Food Interactions     Medication list reviewed in Epic. The patient was instructed to inform the care team before taking any new medications or supplements. No drug interactions identified.     Storage, Handling Precautions, & Disposal     Store this medication in the refrigerator, 2??C to 8??C (36??F to 46??F). in the original carton.  Protect from direct sunlight and do not freeze. Must be used within 4 hours after removal from refrigerator.  Do not shake.  Dispose of used syringes in a sharps disposal container.      Current Medications (including OTC/herbals), Comorbidities and Allergies     Current Outpatient Medications   Medication Sig Dispense Refill    dextroamphetamine-amphetamine (ADDERALL) 20 mg tablet Take 1.5 tablets (30 mg total) by mouth daily. 45 tablet 0    EPINEPHrine (EPIPEN) 0.3 mg/0.3 mL injection Inject 0.3 mL (0.3 mg total) into the muscle every five (5) minutes as needed for anaphylaxis. 2 each 3    ibuprofen (MOTRIN) 800 MG tablet Take 1 tablet (800 mg total) by mouth Three (3) times a day.      omalizumab (XOLAIR) 150 mg/mL syringe Inject the contents of 2 syringes (300 mg total) under the skin every twenty-one (21) days. 2 mL 11    triamcinolone (KENALOG) 0.1 % cream Apply topically Two (2) times a day. Apply to affected areas only. Apply twice daily until skin becomes smooth. 80 g 2    UNABLE TO FIND AZE/IVE/MET 15/1/1% CREAM       No current facility-administered medications for this visit.       Allergies   Allergen Reactions    Cefaclor Hives    Azithromycin Nausea And Vomiting       Patient Active Problem List   Diagnosis    Breast lump    Folliculitis of nose    Idiopathic urticaria    Liver hemangioma    Mast cell disease    Non-seasonal allergic rhinitis due to pollen    Rapid heartbeat    Thyroid nodule    Abnormal liver function test    Palpitations       Reviewed and up to date in Epic.    Appropriateness of  Therapy     Acute infections noted within Epic:  No active infections  Patient reported infection: None    Is medication and dose appropriate based on diagnosis and infection status? Yes    Prescription has been clinically reviewed: Yes      Baseline Quality of Life Assessment      How many days over the past month did your mast cell disorder / idiopathic urticaria  keep you from your normal activities? For example, brushing your teeth or getting up in the morning. 0    Financial Information     Medication Assistance provided: Prior Authorization and Copay Assistance    Anticipated copay of $0 / 21 days reviewed with patient. Verified delivery address.    Delivery Information     Scheduled delivery date: 04/26/22    Expected start date: Continuation of therapy - Vanessa Hale states next dose is due either tomorrow or Wednesday but will take once she receives it on Wednesday.    Patient was notified of new phone menu: No    Medication will be delivered via UPS to the prescription address in Epic [place].  This shipment will not require a signature.      Explained the services we provide at [facility] and that each month we would call to set up refills.  Stressed importance of returning phone calls so that we could ensure they receive their medications in time each month.  Informed patient that we should be setting up refills 7-10 days prior to when they will run out of medication.  A pharmacist will reach out to perform a clinical assessment periodically.  Informed patient that a welcome packet, containing information about our pharmacy and other support services, a Notice of Privacy Practices, and a drug information handout will be sent.      The patient or caregiver noted above participated in the development of this care plan and knows that they can request review of or adjustments to the care plan at any time.      Patient or caregiver verbalized understanding of the above information as well as how to contact the pharmacy at [phone] option 4 with any questions/concerns.  The pharmacy is open Monday through Friday 8:30am-4:30pm.  A pharmacist is available 24/7 via pager to answer any clinical questions they may have.    Patient Specific Needs     Does the patient have any physical, cognitive, or cultural barriers? No    Does the patient have adequate living arrangements? (i.e. the ability to store and take their medication appropriately) Yes    Did you identify any home environmental safety or security hazards? No    Patient prefers to have medications discussed with  Patient     Is the patient or caregiver able to read and understand education materials at a high school level or above? Yes    Patient's primary language is  English     Is the patient high risk? No    SOCIAL DETERMINANTS OF HEALTH     At the [facility], we have learned that life circumstances - like trouble affording food, housing, utilities, or transportation can affect the health of many of our patients.   That is why we wanted to ask: are you currently experiencing any life circumstances that are negatively impacting your health and/or quality of life? Patient declined to answer    Social Determinants of Health     Financial Resource Strain: Not on file  Internet Connectivity: Not on file   Food Insecurity: Not on file   Tobacco Use: Low Risk  (04/07/2022)    Patient History     Smoking Tobacco Use: Never     Smokeless Tobacco Use: Never     Passive Exposure: Not on file   Housing/Utilities: Not on file   Alcohol Use: Not At Risk (09/15/2021)    Alcohol Use     How often do you have a drink containing alcohol?: Monthly or less     How many drinks containing alcohol do you have on a typical day when you are drinking?: 1 - 2     How often do you have 5 or more drinks on one occasion?: Never   Transportation Needs: No Transportation Needs (09/15/2021)    PRAPARE - Therapist, art (Medical): No     Lack of Transportation (Non-Medical): No   Substance Use: Not on file   Health Literacy: Not on file   Physical Activity: Not on file   Interpersonal Safety: Not on file   Stress: Not on file   Intimate Partner Violence: Not on file   Depression: Not at risk (09/15/2021)    PHQ-2     PHQ-2 Score: 0   Social Connections: Not on file       Would you be willing to receive help with any of the needs that you have identified today? Not applicable       Oliva Bustard, PharmD  [facility] Specialty Pharmacist PharmD  [facility] Specialty Pharmacist

## 2022-04-19 LAB — VITAMIN D 25 HYDROXY: VITAMIN D, TOTAL (25OH): 30.7 ng/mL (ref 20.0–80.0)

## 2022-04-25 MED ORDER — DEXTROAMPHETAMINE-AMPHETAMINE 20 MG TABLET
ORAL_TABLET | Freq: Every day | ORAL | 0 refills | 30 days | Status: CP
Start: 2022-04-25 — End: 2023-04-25

## 2022-04-25 NOTE — Unmapped (Signed)
Patient is requesting the following refill  Requested Prescriptions     Pending Prescriptions Disp Refills    dextroamphetamine-amphetamine (ADDERALL) 20 mg tablet 45 tablet 0     Sig: Take 1.5 tablets (30 mg total) by mouth daily.       Last refill given on: 03/24/22 with 45 count and 0 refills.     Recent Visits  Date Type Provider Dept   04/07/22 Office Visit Libby Maw, [place] [facility]   12/21/21 Office Visit Libby Maw, [place] [facility]   09/15/21 Office Visit Libby Maw, [place] [facility]   Showing recent visits within past 365 days with a meds authorizing provider and meeting all other requirements  Future Appointments  Date Type Provider Dept   07/06/22 Appointment Libby Maw, FNP [place] [facility] At [facility]   Showing future appointments within next 365 days with a meds authorizing provider and meeting all other requirements       Opioid Monitoring   Urine Tox Screen Last Drug Screen Date: Not Found  Opiate Confirmation Test Last Drug Screen Date: Not Found  Last Opioid Dispensed Provider: Theophilus Bones, MD  Prescribed MEDD : 0  Last PDMP Review: 01/17/2022  7:50 AM  Last Opioid Pain Agreement Signed Date: Not Found  Last Non Opioid Controlled Substance Pain Agreement Signed Date: Not Found    Naloxone Ordered: Not Found  Last OV: 04/07/2022

## 2022-05-14 NOTE — Unmapped (Signed)
[facility] Specialty Pharmacy Refill Coordination Note    Vanessa Hale, DOB: 19-Aug-1985  Phone: [phone] (home)       All above HIPAA information was verified with patient.         05/13/2022     9:00 PM   Specialty Rx Medication Refill Questionnaire   Which Medications would you like refilled and shipped? Xolair 150mg  x2   Please list all current allergies: No changes   Have you missed any doses in the last 30 days? No   Have you had any changes to your medication(s) since your last refill? No   How many days remaining of each medication do you have at home? None   If receiving an injectable medication, next injection date is 05/17/2022   Have you experienced any side effects in the last 30 days? No   Please enter the full address (street address, city, state, zip code) where you would like your medication(s) to be delivered to. [address] [place] [address]   Please specify on which day you would like your medication(s) to arrive. Note: if you need your medication(s) within 3 days, please call the pharmacy to schedule your order at [phone]  05/17/2022   Has your insurance changed since your last refill? No   Would you like a pharmacist to call you to discuss your medication(s)? No   Do you require a signature for your package? (Note: if we are billing Medicare Part B or your order contains a controlled substance, we will require a signature) No         Completed refill call assessment today to schedule patient's medication shipment from the [facility] [phone]).  All relevant notes have been reviewed.       Confirmed patient received a Conservation officer, historic buildings and a Surveyor, mining with first shipment. The patient will receive a drug information handout for each medication shipped and additional FDA Medication Guides as required.         REFERRAL TO PHARMACIST     Referral to the pharmacist: Not needed      [facility]     Shipping address confirmed in Epic. Delivery Scheduled: Yes, Expected medication delivery date: 05/17/22.     Medication will be delivered via UPS to the prescription address in Epic WAM.    Joeleen Wortley' W [name] Shared [facility] Specialty Technician

## 2022-05-16 MED FILL — XOLAIR 150 MG/ML SUBCUTANEOUS SYRINGE: SUBCUTANEOUS | 21 days supply | Qty: 2 | Fill #1

## 2022-05-25 MED ORDER — DEXTROAMPHETAMINE-AMPHETAMINE 20 MG TABLET
ORAL_TABLET | Freq: Every day | ORAL | 0 refills | 30 days | Status: CP
Start: 2022-05-25 — End: 2023-05-25

## 2022-05-25 NOTE — Unmapped (Signed)
Patient is requesting the following refill  Requested Prescriptions     Pending Prescriptions Disp Refills    dextroamphetamine-amphetamine (ADDERALL) 20 mg tablet 45 tablet 0     Sig: Take 1.5 tablets (30 mg total) by mouth daily.       Last refill given on: 04/07/2022 with 45 count and 0 refills.     Recent Visits  Date Type Provider Dept   04/07/22 Office Visit Libby Maw, [place] [facility]   12/21/21 Office Visit Libby Maw, [place] [facility]   09/15/21 Office Visit Libby Maw, [place] [facility]   Showing recent visits within past 365 days with a meds authorizing provider and meeting all other requirements  Future Appointments  Date Type Provider Dept   07/06/22 Appointment Libby Maw, FNP  [facility] At [facility]   Showing future appointments within next 365 days with a meds authorizing provider and meeting all other requirements       Opioid Monitoring   Urine Tox Screen Last Drug Screen Date: Not Found  Opiate Confirmation Test Last Drug Screen Date: Not Found  Last Opioid Dispensed Provider: Theophilus Bones, MD  Prescribed MEDD : 0  Last PDMP Review: 04/25/2022  8:51 AM  Last Opioid Pain Agreement Signed Date: Not Found  Last Non Opioid Controlled Substance Pain Agreement Signed Date: Not Found    Naloxone Ordered: Not Found  Last OV: 04/07/2022

## 2022-06-01 NOTE — Unmapped (Signed)
[facility] Specialty Pharmacy Refill Coordination Note    Vanessa Hale, DOB: 09-Jun-1985  Phone: [phone] (home)       All above HIPAA information was verified with patient.         05/31/2022     6:31 AM   Specialty Rx Medication Refill Questionnaire   Which Medications would you like refilled and shipped? Xolair 150 mg x2   Please list all current allergies: Ceclor, xpack   Have you missed any doses in the last 30 days? No   Have you had any changes to your medication(s) since your last refill? No   How many days remaining of each medication do you have at home? 0   If receiving an injectable medication, next injection date is 06/07/2022   Have you experienced any side effects in the last 30 days? No   Please enter the full address (street address, city, state, zip code) where you would like your medication(s) to be delivered to. [address] [place] [address]   Please specify on which day you would like your medication(s) to arrive. Note: if you need your medication(s) within 3 days, please call the pharmacy to schedule your order at [phone]  06/06/2022   Has your insurance changed since your last refill? No   Would you like a pharmacist to call you to discuss your medication(s)? No   Do you require a signature for your package? (Note: if we are billing Medicare Part B or your order contains a controlled substance, we will require a signature) No         Completed refill call assessment today to schedule patient's medication shipment from the [facility] [phone]).  All relevant notes have been reviewed.       Confirmed patient received a Conservation officer, historic buildings and a Surveyor, mining with first shipment. The patient will receive a drug information handout for each medication shipped and additional FDA Medication Guides as required.         REFERRAL TO PHARMACIST     Referral to the pharmacist: Not needed      [facility]     Shipping address confirmed in Epic. Delivery Scheduled: Yes, Expected medication delivery date: 06/06/22.     Medication will be delivered via UPS to the prescription address in Epic WAM.    Johathan Province' W [name] Shared [facility] Specialty Technician

## 2022-06-05 MED FILL — XOLAIR 150 MG/ML SUBCUTANEOUS SYRINGE: SUBCUTANEOUS | 21 days supply | Qty: 2 | Fill #2

## 2022-06-21 NOTE — Unmapped (Signed)
[facility] Specialty Pharmacy Refill Coordination Note    Vanessa Hale, DOB: March 25, 1985  Phone: [phone] (home)       All above HIPAA information was verified with patient.         06/21/2022    10:39 AM   Specialty Rx Medication Refill Questionnaire   Which Medications would you like refilled and shipped? Xolair 150 mg x 2   Please list all current allergies: Ceclor, zpack   Have you missed any doses in the last 30 days? No   Have you had any changes to your medication(s) since your last refill? No   How many days remaining of each medication do you have at home? 0   If receiving an injectable medication, next injection date is 06/27/2022   Have you experienced any side effects in the last 30 days? No   Please enter the full address (street address, city, state, zip code) where you would like your medication(s) to be delivered to. [address] [place] [address]   Please specify on which day you would like your medication(s) to arrive. Note: if you need your medication(s) within 3 days, please call the pharmacy to schedule your order at [phone]  06/26/2022   Has your insurance changed since your last refill? No   Would you like a pharmacist to call you to discuss your medication(s)? No   Do you require a signature for your package? (Note: if we are billing Medicare Part B or your order contains a controlled substance, we will require a signature) No         Completed refill call assessment today to schedule patient's medication shipment from the [facility] [phone]).  All relevant notes have been reviewed.       Confirmed patient received a Conservation officer, historic buildings and a Surveyor, mining with first shipment. The patient will receive a drug information handout for each medication shipped and additional FDA Medication Guides as required.         REFERRAL TO PHARMACIST     Referral to the pharmacist: Not needed      [facility]     Shipping address confirmed in Epic. Delivery Scheduled: Yes, Expected medication delivery date: 06/27/22.     Medication will be delivered via UPS to the prescription address in Epic WAM.    Vanessa Hale [facility] Shared [facility] Specialty Technician

## 2022-06-23 DIAGNOSIS — D4709 Other mast cell neoplasms of uncertain behavior: Principal | ICD-10-CM

## 2022-06-23 DIAGNOSIS — L501 Idiopathic urticaria: Principal | ICD-10-CM

## 2022-06-24 MED ORDER — DEXTROAMPHETAMINE-AMPHETAMINE 20 MG TABLET
ORAL_TABLET | Freq: Every day | ORAL | 0 refills | 30 days
Start: 2022-06-24 — End: 2023-06-24

## 2022-06-26 MED ORDER — DEXTROAMPHETAMINE-AMPHETAMINE 20 MG TABLET
ORAL_TABLET | Freq: Every day | ORAL | 0 refills | 30 days | Status: CP
Start: 2022-06-26 — End: 2023-06-26

## 2022-06-26 NOTE — Unmapped (Signed)
Patient is requesting the following refill  Requested Prescriptions     Pending Prescriptions Disp Refills    dextroamphetamine-amphetamine (ADDERALL) 20 mg tablet 45 tablet 0     Sig: Take 1.5 tablets (30 mg total) by mouth daily.       Last refill given on: 05/25/22 with 45 count and 0 refills.     Recent Visits  Date Type Provider Dept   04/07/22 Office Visit Libby Maw, [place] [facility]   12/21/21 Office Visit Libby Maw, [place] [facility]   09/15/21 Office Visit Libby Maw, [place] [facility]   Showing recent visits within past 365 days with a meds authorizing provider and meeting all other requirements  Future Appointments  Date Type Provider Dept   07/06/22 Appointment Libby Maw, FNP [place] [facility]   Showing future appointments within next 365 days with a meds authorizing provider and meeting all other requirements       Opioid Monitoring   Urine Tox Screen Last Drug Screen Date: Not Found  Opiate Confirmation Test Last Drug Screen Date: Not Found  Last Opioid Dispensed Provider: Theophilus Bones, MD  Prescribed MEDD : 0  Last PDMP Review: 05/25/2022  7:55 AM  Last Opioid Pain Agreement Signed Date: Not Found  Last Non Opioid Controlled Substance Pain Agreement Signed Date: Not Found    Naloxone Ordered: Not Found  Last OV: 04/07/2022

## 2022-06-26 NOTE — Unmapped (Signed)
Vanessa Hale 's Xolair shipment will be delayed as a result of prior authorization being required by the patient's insurance.     I have reached out to the patient  at [phone] and left a voicemail message.  We will call the patient back to reschedule the delivery upon resolution. We have not confirmed the new delivery date.

## 2022-06-27 MED FILL — XOLAIR 150 MG/ML SUBCUTANEOUS SYRINGE: SUBCUTANEOUS | 21 days supply | Qty: 2 | Fill #3

## 2022-07-17 NOTE — Unmapped (Signed)
[facility] Specialty Pharmacy Refill Coordination Note    Vanessa Hale, DOB: 08-05-1985  Phone: [phone] (home)       All above HIPAA information was verified with patient.         07/17/2022     8:32 AM   Specialty Rx Medication Refill Questionnaire   Which Medications would you like refilled and shipped? Xolair 150 mg x 2   Please list all current allergies: Zpack, ceclor   Have you missed any doses in the last 30 days? No   Have you had any changes to your medication(s) since your last refill? No   How many days remaining of each medication do you have at home? 0   If receiving an injectable medication, next injection date is 07/19/2022   Have you experienced any side effects in the last 30 days? No   Please enter the full address (street address, city, state, zip code) where you would like your medication(s) to be delivered to. [address] [place] [address]   Please specify on which day you would like your medication(s) to arrive. Note: if you need your medication(s) within 3 days, please call the pharmacy to schedule your order at [phone]  07/19/2022   Has your insurance changed since your last refill? No   Would you like a pharmacist to call you to discuss your medication(s)? No   Do you require a signature for your package? (Note: if we are billing Medicare Part B or your order contains a controlled substance, we will require a signature) No         Completed refill call assessment today to schedule patient's medication shipment from the [facility] [phone]).  All relevant notes have been reviewed.       Confirmed patient received a Conservation officer, historic buildings and a Surveyor, mining with first shipment. The patient will receive a drug information handout for each medication shipped and additional FDA Medication Guides as required.         REFERRAL TO PHARMACIST     Referral to the pharmacist: Not needed      [facility]     Shipping address confirmed in Epic. Delivery Scheduled: Yes, Expected medication delivery date: 07/19/22.     Medication will be delivered via UPS to the prescription address in Epic WAM.    Gabrielly Mccrystal' W [name] Shared [facility] Specialty Technician

## 2022-07-19 MED FILL — XOLAIR 150 MG/ML SUBCUTANEOUS SYRINGE: SUBCUTANEOUS | 21 days supply | Qty: 2 | Fill #4

## 2022-07-24 MED ORDER — DEXTROAMPHETAMINE-AMPHETAMINE 20 MG TABLET
ORAL_TABLET | Freq: Every day | ORAL | 0 refills | 30 days
Start: 2022-07-24 — End: 2023-07-24

## 2022-07-24 NOTE — Unmapped (Signed)
Patient is requesting the following refill  Requested Prescriptions     Pending Prescriptions Disp Refills    dextroamphetamine-amphetamine (ADDERALL) 20 mg tablet 45 tablet 0     Sig: Take 1.5 tablets (30 mg total) by mouth daily.       Last refill given on: 06/26/2022 with 45 count and 0 refills.     Recent Visits  Date Type Provider Dept   04/07/22 Office Visit Libby Maw, [place] [facility]   12/21/21 Office Visit Libby Maw, [place] [facility]   09/15/21 Office Visit Libby Maw, [place] [facility]   Showing recent visits within past 365 days with a meds authorizing provider and meeting all other requirements  Future Appointments  Date Type Provider Dept   08/17/22 Appointment Libby Maw, FNP [place] [facility]   Showing future appointments within next 365 days with a meds authorizing provider and meeting all other requirements       Opioid Monitoring   Urine Tox Screen Last Drug Screen Date: Not Found  Opiate Confirmation Test Last Drug Screen Date: Not Found  Last Opioid Dispensed Provider: Theophilus Bones, MD  Prescribed MEDD : 0  Last PDMP Review: 05/25/2022  7:55 AM  Last Opioid Pain Agreement Signed Date: Not Found  Last Non Opioid Controlled Substance Pain Agreement Signed Date: Not Found    Naloxone Ordered: Not Found  Last OV: 04/07/2022

## 2022-07-25 MED ORDER — DEXTROAMPHETAMINE-AMPHETAMINE 20 MG TABLET
ORAL_TABLET | Freq: Every day | ORAL | 0 refills | 30 days | Status: CP
Start: 2022-07-25 — End: 2023-07-25

## 2022-08-07 NOTE — Unmapped (Signed)
[facility] Specialty Pharmacy Refill Coordination Note    Ariene Kless, DOB: [birth year]  Phone: [phone] (home)       All above HIPAA information was verified with patient.         08/07/2022     7:31 AM   Specialty Rx Medication Refill Questionnaire   Which Medications would you like refilled and shipped? Xolair 150 mg  x 2   Please list all current allergies: Ceclor, zpack   Have you missed any doses in the last 30 days? No   Have you had any changes to your medication(s) since your last refill? No   How many days remaining of each medication do you have at home? 0   If receiving an injectable medication, next injection date is 08/09/2022   Have you experienced any side effects in the last 30 days? No   Please enter the full address (street address, city, state, zip code) where you would like your medication(s) to be delivered to. [address] [place] [address]   Please specify on which day you would like your medication(s) to arrive. Note: if you need your medication(s) within 3 days, please call the pharmacy to schedule your order at [phone]  08/09/2022   Has your insurance changed since your last refill? No   Would you like a pharmacist to call you to discuss your medication(s)? No   Do you require a signature for your package? (Note: if we are billing Medicare Part B or your order contains a controlled substance, we will require a signature) No         Completed refill call assessment today to schedule patient's medication shipment from the [facility] [phone]).  All relevant notes have been reviewed.       Confirmed patient received a Conservation officer, historic buildings and a Surveyor, mining with first shipment. The patient will receive a drug information handout for each medication shipped and additional FDA Medication Guides as required.         REFERRAL TO PHARMACIST     Referral to the pharmacist: Not needed      [facility]     Shipping address confirmed in Epic. Delivery Scheduled: Yes, Expected medication delivery date: 08/09/22.     Medication will be delivered via Same Day Courier to the prescription address in Epic WAM.    Devony Mcgrady' W [name] Shared [facility] Specialty Technician

## 2022-08-09 MED FILL — XOLAIR 150 MG/ML SUBCUTANEOUS SYRINGE: SUBCUTANEOUS | 21 days supply | Qty: 2 | Fill #5

## 2022-08-17 ENCOUNTER — Institutional Professional Consult (permissible substitution): Admit: 2022-08-17 | Payer: PRIVATE HEALTH INSURANCE | Attending: Family | Primary: Family

## 2022-08-17 NOTE — Unmapped (Signed)
Contact Information     Person Contacted: Patient  Contact Phone number: [phone] (home)   Phone Outcome: Contacted Patient/Caregiver  Is there someone else in the room? No.   I have identified myself to the patient and conveyed my credentials to Ms. Singley    Ms. Kozloff is a 37 y.o. female  participating in a telephone encounter.  I have reviewed the problem list, medications, and allergies and have updated/reconciled them if needed.    REASON FOR CALL     Adhd follow up    SUBJECTIVE     Patient presents for telephone visit for follow up of adhd (father currently at [place] waiting for PAD procedure)  She reports doing pretty good   Continues to recover from right knee arthroscopy and repair of the meniscus  Feels like she has done better with her weight gain   has been trying to eat better - losing some    Had significant reaction to reglan recently during the knee surgery    Assessment/Plan     Diagnoses and all orders for this visit:    Attention deficit disorder, unspecified hyperactivity presence  Patient presents for three month follow up adhd.  Continues to report improvement in adhd with adderall.   She will continue 30 mg po daily.   Refilled today (post-dated prescription)  -     dextroamphetamine-amphetamine (ADDERALL) 20 mg tablet; Take 1.5 tablets (30 mg total) by mouth daily.    Visit Conducted via Telephone      The patient reports they are physically located in [place] and is currently: not at home. I conducted a phone visit.  I spent 10 minutes on the phone call with the patient on the date of service .

## 2022-08-24 MED ORDER — DEXTROAMPHETAMINE-AMPHETAMINE 20 MG TABLET
ORAL_TABLET | Freq: Every day | ORAL | 0 refills | 30 days | Status: CP
Start: 2022-08-24 — End: 2023-08-24

## 2022-08-29 NOTE — Unmapped (Signed)
[facility] Specialty Pharmacy Refill Coordination Note    Vanessa Hale, DOB: [birth year]  Phone: [phone] (home)       All above HIPAA information was verified with patient.         08/29/2022     1:00 PM   Specialty Rx Medication Refill Questionnaire   Which Medications would you like refilled and shipped? Xolair 150 mg x 2   Please list all current allergies: Ceclor, zpack   Have you missed any doses in the last 30 days? No   Have you had any changes to your medication(s) since your last refill? No   How many days remaining of each medication do you have at home? 0   If receiving an injectable medication, next injection date is 08/30/2022   Have you experienced any side effects in the last 30 days? No   Please enter the full address (street address, city, state, zip code) where you would like your medication(s) to be delivered to. [address] [place] [address]   Please specify on which day you would like your medication(s) to arrive. Note: if you need your medication(s) within 3 days, please call the pharmacy to schedule your order at [phone]  08/31/2022   Has your insurance changed since your last refill? No   Would you like a pharmacist to call you to discuss your medication(s)? No   Do you require a signature for your package? (Note: if we are billing Medicare Part B or your order contains a controlled substance, we will require a signature) No         Completed refill call assessment today to schedule patient's medication shipment from the [facility] [phone]).  All relevant notes have been reviewed.       Confirmed patient received a Conservation officer, historic buildings and a Surveyor, mining with first shipment. The patient will receive a drug information handout for each medication shipped and additional FDA Medication Guides as required.         REFERRAL TO PHARMACIST     Referral to the pharmacist: Not needed      [facility]     Shipping address confirmed in Epic. Delivery Scheduled: Yes, Expected medication delivery date: 08/31/22.     Medication will be delivered via Same Day Courier to the prescription address in Epic WAM.    Laveda Demedeiros' W [name] Shared [facility] Specialty Technician

## 2022-08-31 MED FILL — XOLAIR 150 MG/ML SUBCUTANEOUS SYRINGE: SUBCUTANEOUS | 21 days supply | Qty: 2 | Fill #6

## 2022-09-19 NOTE — Unmapped (Signed)
[facility] Refill Coordination Note    Vanessa Hale, DOB: 1986-01-10  Phone: [phone] (home)       All above HIPAA information was verified with patient.         09/19/2022    10:13 AM   Specialty Rx Medication Refill Questionnaire   Which Medications would you like refilled and shipped? Xolair 150 mg. X 2   Please list all current allergies: Ceclor, zpack   Have you missed any doses in the last 30 days? No   Have you had any changes to your medication(s) since your last refill? No   How many days remaining of each medication do you have at home? 0   If receiving an injectable medication, next injection date is 09/21/2022   Have you experienced any side effects in the last 30 days? No   Please enter the full address (street address, city, state, zip code) where you would like your medication(s) to be delivered to. [address] [place] [address]   Please specify on which day you would like your medication(s) to arrive. Note: if you need your medication(s) within 3 days, please call the pharmacy to schedule your order at [phone]  09/21/2022   Has your insurance changed since your last refill? No   Would you like a pharmacist to call you to discuss your medication(s)? No   Do you require a signature for your package? (Note: if we are billing Medicare Part B or your order contains a controlled substance, we will require a signature) No         Completed refill call assessment today to schedule patient's medication shipment from the [facility] [phone]).  All relevant notes have been reviewed.       Confirmed patient received a Conservation officer, historic buildings and a Surveyor, mining with first shipment. The patient will receive a drug information handout for each medication shipped and additional FDA Medication Guides as required.         REFERRAL TO PHARMACIST     Referral to the pharmacist: Not needed      [facility]     Shipping address confirmed in Epic. Delivery Scheduled: Yes, Expected medication delivery date: 09/21/2022.     Medication will be delivered via Same Day Courier to the prescription address in Epic WAM.    Dorisann Frames   [facility] Shared [facility] Specialty Technician

## 2022-09-21 MED FILL — XOLAIR 150 MG/ML SUBCUTANEOUS SYRINGE: SUBCUTANEOUS | 21 days supply | Qty: 2 | Fill #7

## 2022-09-28 DIAGNOSIS — F988 Other specified behavioral and emotional disorders with onset usually occurring in childhood and adolescence: Principal | ICD-10-CM

## 2022-09-28 MED ORDER — DEXTROAMPHETAMINE-AMPHETAMINE 20 MG TABLET
ORAL_TABLET | Freq: Every day | ORAL | 0 refills | 30 days | Status: CP
Start: 2022-09-28 — End: 2023-09-28

## 2022-10-07 NOTE — Unmapped (Signed)
[facility] Refill Coordination Note    Evelene Roussin, DOB: 1985-08-14  Phone: [phone] (home)       All above HIPAA information was verified with patient.         10/06/2022     4:25 PM   Specialty Rx Medication Refill Questionnaire   Which Medications would you like refilled and shipped? Xolair 150mg  x 2   Please list all current allergies: Ceclor, zpack   Have you missed any doses in the last 30 days? No   Have you had any changes to your medication(s) since your last refill? No   How many days remaining of each medication do you have at home? 0   If receiving an injectable medication, next injection date is 10/10/2022   Have you experienced any side effects in the last 30 days? No   Please enter the full address (street address, city, state, zip code) where you would like your medication(s) to be delivered to. [address] [place] [address]   Please specify on which day you would like your medication(s) to arrive. Note: if you need your medication(s) within 3 days, please call the pharmacy to schedule your order at [phone]  10/10/2022   Has your insurance changed since your last refill? No   Would you like a pharmacist to call you to discuss your medication(s)? No   Do you require a signature for your package? (Note: if we are billing Medicare Part B or your order contains a controlled substance, we will require a signature) No         Completed refill call assessment today to schedule patient's medication shipment from the [facility] [phone]).  All relevant notes have been reviewed.       Confirmed patient received a Conservation officer, historic buildings and a Surveyor, mining with first shipment. The patient will receive a drug information handout for each medication shipped and additional FDA Medication Guides as required.         REFERRAL TO PHARMACIST     Referral to the pharmacist: Not needed      [facility]     Shipping address confirmed in Epic. Delivery Scheduled: Yes, Expected medication delivery date: 10/10/22.     Medication will be delivered via Same Day Courier to the prescription address in Epic WAM.    Kamiya Acord' W [name] Shared [facility] Specialty Technician

## 2022-10-10 MED FILL — XOLAIR 150 MG/ML SUBCUTANEOUS SYRINGE: SUBCUTANEOUS | 21 days supply | Qty: 2 | Fill #8

## 2022-10-26 DIAGNOSIS — F988 Other specified behavioral and emotional disorders with onset usually occurring in childhood and adolescence: Principal | ICD-10-CM

## 2022-10-26 MED ORDER — DEXTROAMPHETAMINE-AMPHETAMINE 20 MG TABLET
ORAL_TABLET | Freq: Every day | ORAL | 0 refills | 30 days | Status: CP
Start: 2022-10-26 — End: 2023-10-26

## 2022-10-26 NOTE — Unmapped (Signed)
Patient is requesting the following refill  Requested Prescriptions     Pending Prescriptions Disp Refills    dextroamphetamine-amphetamine (ADDERALL) 20 mg tablet 45 tablet 0     Sig: Take 1.5 tablets (30 mg total) by mouth daily.       Last refill given on: 09/28/2022 with 45 count and 0 refills.     Recent Visits  Date Type Provider Dept   04/07/22 Office Visit Libby Maw, [place] [facility]   12/21/21 Office Visit Libby Maw, [place] [facility]   Showing recent visits within past 365 days and meeting all other requirements  Future Appointments  Date Type Provider Dept   11/22/22 Appointment Libby Maw, FNP [place]   Showing future appointments within next 365 days and meeting all other requirements       Opioid Monitoring   Urine Tox Screen Last Drug Screen Date: Not Found  Opiate Confirmation Test Last Drug Screen Date: Not Found  Last Opioid Dispensed Provider: Theophilus Bones, MD  Prescribed MEDD : 0  Last PDMP Review: 07/25/2022  7:57 AM  Last Opioid Pain Agreement Signed Date: Not Found  Last Non Opioid Controlled Substance Pain Agreement Signed Date: Not Found    Naloxone Ordered: Not Found  Last OV: 04/07/2022

## 2022-10-31 NOTE — Unmapped (Signed)
[facility] Refill Coordination Note    Vanessa Hale, DOB: 1985-07-11  Phone: [phone] (home)       All above HIPAA information was verified with patient.         10/30/2022    12:41 PM   Specialty Rx Medication Refill Questionnaire   Which Medications would you like refilled and shipped? Xolair 150 x2   Please list all current allergies: Ceclor zpack   Have you missed any doses in the last 30 days? No   Have you had any changes to your medication(s) since your last refill? No   How many days remaining of each medication do you have at home? 0   If receiving an injectable medication, next injection date is 11/01/2022   Have you experienced any side effects in the last 30 days? No   Please enter the full address (street address, city, state, zip code) where you would like your medication(s) to be delivered to. [address] [facility] [place] [address] [place]   Please specify on which day you would like your medication(s) to arrive. Note: if you need your medication(s) within 3 days, please call the pharmacy to schedule your order at [phone]  11/01/2022   Has your insurance changed since your last refill? No   Would you like a pharmacist to call you to discuss your medication(s)? No   Do you require a signature for your package? (Note: if we are billing Medicare Part B or your order contains a controlled substance, we will require a signature) No         Completed refill call assessment today to schedule patient's medication shipment from the [facility] [phone]).  All relevant notes have been reviewed.       Confirmed patient received a Conservation officer, historic buildings and a Surveyor, mining with first shipment. The patient will receive a drug information handout for each medication shipped and additional FDA Medication Guides as required.         REFERRAL TO PHARMACIST     Referral to the pharmacist: Not needed      [facility]     Shipping address confirmed in Epic. Delivery Scheduled: Yes, Expected medication delivery date: 11/01/22.     Medication will be delivered via Same Day Courier to the prescription address in Epic WAM.    Renee Beale' W [name] Shared [facility] Specialty Technician

## 2022-11-01 MED FILL — XOLAIR 150 MG/ML SUBCUTANEOUS SYRINGE: SUBCUTANEOUS | 21 days supply | Qty: 2 | Fill #9

## 2022-11-21 NOTE — Unmapped (Signed)
[facility] Special[facility] and Home Delivery Pharmacy Refill Coordination Note    Vanessa Hale, [place]: 1985-03-22  Phone: [phone] (home)       All above HIPAA information was verified with patient.         11/20/2022    12:15 PM   Specialty Rx Medication Refill Questionnaire   Which Medications would you like refilled and shipped? Xolair 150 mg x 2   Please list all current allergies: Ceclor zpack   Have you missed any doses in the last 30 days? No   Have you had any changes to your medication(s) since your last refill? No   How many days remaining of each medication do you have at home? 0   If receiving an injectable medication, next injection date is 11/23/2022   Have you experienced any side effects in the last 30 days? No   Please enter the full address (street address, city, state, zip code) where you would like your medication(s) to be delivered to. [address] [facility] [place] [address] [place]   Please specify on which day you would like your medication(s) to arrive. Note: if you need your medication(s) within 3 days, please call the pharmacy to schedule your order at [phone]  11/23/2022   Has your insurance changed since your last refill? No   Would you like a pharmacist to call you to discuss your medication(s)? No   Do you require a signature for your package? (Note: if we are billing Medicare Part B or your order contains a controlled substance, we will require a signature) No         Completed refill call assessment today to schedule patient's medication shipment from the [facility] and Home Delivery Pharmacy [phone]).  All relevant notes have been reviewed.       Confirmed patient received a Conservation officer, historic buildings and a Surveyor, mining with first shipment. The patient will receive a drug information handout for each medication shipped and additional FDA Medication Guides as required.         REFERRAL TO PHARMACIST     Referral to the pharmacist: Not needed      [facility]     Shipping address confirmed in Epic.     Delivery Scheduled: Yes, Expected medication delivery date: 11/23/22.     Medication will be delivered via Same Day Courier to the prescription address in Epic WAM.    Vanessa Hale Specialty and Home Delivery Pharmacy Specialty Technician

## 2022-11-22 ENCOUNTER — Ambulatory Visit: Admit: 2022-11-22 | Discharge: 2022-11-23 | Payer: PRIVATE HEALTH INSURANCE | Attending: Family | Primary: Family

## 2022-11-22 DIAGNOSIS — I479 Paroxysmal tachycardia, unspecified: Principal | ICD-10-CM

## 2022-11-22 DIAGNOSIS — R7989 Other specified abnormal findings of blood chemistry: Principal | ICD-10-CM

## 2022-11-22 DIAGNOSIS — F988 Other specified behavioral and emotional disorders with onset usually occurring in childhood and adolescence: Principal | ICD-10-CM

## 2022-11-22 DIAGNOSIS — R635 Abnormal weight gain: Principal | ICD-10-CM

## 2022-11-22 LAB — COMPREHENSIVE METABOLIC PANEL
ALBUMIN: 4.6 g/dL (ref 3.4–5.0)
ALKALINE PHOSPHATASE: 84 U/L (ref 38–126)
ALT (SGPT): 40 U/L — ABNORMAL HIGH (ref <35)
ANION GAP: 11 mmol/L (ref 7–15)
AST (SGOT): 35 U/L (ref 14–38)
BILIRUBIN TOTAL: 1.4 mg/dL — ABNORMAL HIGH (ref 0.1–1.2)
BLOOD UREA NITROGEN: 19 mg/dL (ref 7–21)
BUN / CREAT RATIO: 27
CALCIUM: 9.5 mg/dL (ref 8.5–10.2)
CHLORIDE: 98 mmol/L (ref 98–107)
CO2: 28 mmol/L (ref 22.0–32.0)
CREATININE: 0.7 mg/dL (ref 0.60–1.00)
EGFR CKD-EPI (2021) FEMALE: >90 mL/min/{1.73_m2} (ref >=60)
GLUCOSE RANDOM: 75 mg/dL (ref 74–106)
POTASSIUM: 4.4 mmol/L (ref 3.5–5.0)
PROTEIN TOTAL: 7.4 g/dL (ref 6.5–8.3)
SODIUM: 137 mmol/L (ref 135–145)

## 2022-11-22 LAB — CBC W/ AUTO DIFF
BASOPHILS ABSOLUTE COUNT: 0.1 10*9/L (ref 0.0–0.1)
BASOPHILS RELATIVE PERCENT: 1 %
EOSINOPHILS ABSOLUTE COUNT: 0.2 10*9/L (ref 0.0–0.5)
EOSINOPHILS RELATIVE PERCENT: 2.5 %
HEMATOCRIT: 41.8 % (ref 34.0–44.0)
HEMOGLOBIN: 14.3 g/dL (ref 11.3–14.9)
LYMPHOCYTES ABSOLUTE COUNT: 2.5 10*9/L (ref 1.1–3.6)
LYMPHOCYTES RELATIVE PERCENT: 32.2 %
MEAN CORPUSCULAR HEMOGLOBIN CONC: 34.3 g/dL (ref 32.0–36.0)
MEAN CORPUSCULAR HEMOGLOBIN: 31.1 pg (ref 25.9–32.4)
MEAN CORPUSCULAR VOLUME: 90.8 fL (ref 77.6–95.7)
MEAN PLATELET VOLUME: 8.6 fL (ref 6.8–10.7)
MONOCYTES ABSOLUTE COUNT: 0.6 10*9/L (ref 0.3–0.8)
MONOCYTES RELATIVE PERCENT: 8 %
NEUTROPHILS ABSOLUTE COUNT: 4.4 10*9/L (ref 1.8–7.8)
NEUTROPHILS RELATIVE PERCENT: 56.3 %
NUCLEATED RED BLOOD CELLS: 0 /100{WBCs} (ref <=4)
PLATELET COUNT: 295 10*9/L (ref 150–450)
RED BLOOD CELL COUNT: 4.6 10*12/L (ref 3.95–5.13)
RED CELL DISTRIBUTION WIDTH: 12.8 % (ref 12.2–15.2)
WBC ADJUSTED: 7.8 10*9/L (ref 3.6–11.2)

## 2022-11-22 LAB — TSH: THYROID STIMULATING HORMONE: 1.91 u[IU]/mL (ref 0.600–3.300)

## 2022-11-22 MED ORDER — LISDEXAMFETAMINE 30 MG CAPSULE
ORAL_CAPSULE | Freq: Every morning | ORAL | 0 refills | 30 days | Status: CP
Start: 2022-11-22 — End: ?

## 2022-11-23 MED FILL — XOLAIR 150 MG/ML SUBCUTANEOUS SYRINGE: SUBCUTANEOUS | 21 days supply | Qty: 2 | Fill #10

## 2022-11-23 NOTE — Unmapped (Signed)
[facility]     Assessment/Plan     Vanessa Hale was seen today for medication refill and health maintenance.    Diagnoses and all orders for this visit:    Attention deficit disorder, unspecified hyperactivity presence  37 year old female patient presenting today for routine 43-month follow-up of chronic health problems.  Currently treating her ADHD with Adderall and does not feel like this medication helps as good as Vyvanse which she took previously (quit taking it due to insurance quit covering it.  Insurance not covering this medication.  Changing her back to Vyvanse which hopefully will help with her weight gain also  -     lisdexamfetamine (VYVANSE) 30 MG cap capsule; Take 1 capsule (30 mg total) by mouth every morning.    Weight gain  Patient has gained weight since she has not been able to be as active with her right knee meniscus pathology.  Changing back to Vyvanse  -     lisdexamfetamine (VYVANSE) 30 MG cap capsule; Take 1 capsule (30 mg total) by mouth every morning.  -     TSH; Future  -     Comprehensive metabolic panel; Future  -     CBC w/ Differential; Future    Paroxysmal tachycardia, unspecified (CMS-HCC)  History of paroxysmal tachycardia.  Has had evaluation with cardiology in the past and no treatment indicated.  Heart rate is stable today.  Asymptomatic    Abnormal TSH  TSH was slightly low earlier this year.  Rechecking today    Patient response, barriers, and adherence to medication have been identified and addressed. Barriers to the treatment plan have been identified and addressed with patient. Unhealthy behaviors have been noted and addressed as appropriate. Patient voiced understanding and all questions have been answered to satisfaction. Return in about 3 months (around 02/21/2023) for adhd.    ORDERS THIS VISIT     Orders Placed This Encounter   Procedures    TSH    Comprehensive metabolic panel    CBC w/ Differential      No results found for this visit on 11/22/22.  New Prescriptions    LISDEXAMFETAMINE (VYVANSE) 30 MG CAP CAPSULE    Take 1 capsule (30 mg total) by mouth every morning.      Modified Medications    No medications on file      Discontinued Medications    DEXTROAMPHETAMINE-AMPHETAMINE (ADDERALL) 20 MG TABLET    Take 1.5 tablets (30 mg total) by mouth daily.        Subjective:   Time seen  November 22, 2022 5:00 PM    ATF:TDDUK, Vanessa Hale, [place]    Vanessa Hale, is a 37 y.o. female    HPI:  Patient presents today for routine three month follow up of chronic health problems.  Today she complains of right knee issues still s/p arthroscopy.    Reports continues to have issues with her right knee.   Did PT earlier today   Is back at work prn in  resp there at [employer]  Not bracing her knee as much    Stopped taking plaquenil few months ago  Still taking xolair  Plans to follow-up with her allergist as scheduled for ongoing surveillance of her mast cell    Not exercising as much as she used to.   Would like a pellaton since her knee has been an issues     Would like to change back to vyvanse  The following portions of the patient's history were reviewed and updated as appropriate: allergies, current medications, past family history, past medical history, past social history, past surgical history and problem list.    ROS:   A 12 point review of systems was conducted with negative results except as noted in the HPI.     Objective:     Physical Exam  BP 100/70  - Pulse 98  - Temp 36.8 ??C (98.3 ??F) (Oral)  - Ht 160 cm (5' 3)  - Wt 84.8 kg (187 lb)  - SpO2 98%  - BMI 33.13 kg/m??     General-  Normal appearing overly nourished female in no apparent distress.  Neurologic- Alert and oriented X3.  Cranial nerve II-XII grossly intact. No focal abnormality.  HEENT-  Normocephalic atraumatic head.  No scleral icterus.  normal dentition and oral pharynx.  Neck- Supple, JVD normal., No carotid bruits No lymphadenopathy  Respiratory- respirations even and unlabored,  Lungs clear to auscultation a/p, no wheezes, rhonchi, or rales.  Cardiovascular  heart rhythm regular. Murmur not present.  Gastrointestinal- Soft, .  MSK-  No deformity, No clubbing or cyanosis. No edema, pulses intact   Psych- Normal mood, appropriate.  Integumentary - skin warm, dry, and intact    Current Outpatient Medications   Medication Sig Dispense Refill    EPINEPHrine (EPIPEN) 0.3 mg/0.3 mL injection Inject 0.3 mL (0.3 mg total) into the muscle every five (5) minutes as needed for anaphylaxis. 2 each 3    ibuprofen (MOTRIN) 800 MG tablet Take 1 tablet (800 mg total) by mouth Three (3) times a day.      omalizumab (XOLAIR) 150 mg/mL syringe Inject the contents of 2 syringes (300 mg total) under the skin every twenty-one (21) days. 2 mL 11    triamcinolone (KENALOG) 0.1 % cream Apply topically Two (2) times a day. Apply to affected areas only. Apply twice daily until skin becomes smooth. 80 g 2    lisdexamfetamine (VYVANSE) 30 MG cap capsule Take 1 capsule (30 mg total) by mouth every morning. 30 capsule 0    UNABLE TO FIND AZE/IVE/MET 15/1/1% CREAM (Patient not taking: Reported on 11/22/2022)       No current facility-administered medications for this visit.       Past Medical History:   Diagnosis Date    ADD (attention deficit disorder) without hyperactivity     GERD (gastroesophageal reflux disease)     Mast cell disease        Past Surgical History:   Procedure Laterality Date    ADENOIDECTOMY      CESAREAN SECTION, CLASSIC      HYSTERECTOMY      partial    LIVER BIOPSY      PR BIOPSY SOFT TISSUE NECK/CHEST Right 02/15/2021    Procedure: BIOPSY, SOFT TISSUE OF NECK OR THORAX;  Surgeon: Vanessa Meres, MD;  Location: MAIN OR [facility];  Service: ENT    SINUS SURGERY      THYROID SURGERY      biopsy    TONSILLECTOMY         Social History     Socioeconomic History    Marital status: Married     Spouse name: None    Number of children: None    Years of education: None    Highest education level: None   Tobacco Use    Smoking status: Never    Smokeless tobacco: Never   Vaping Use  Vaping status: Never Used   Substance and Sexual Activity    Alcohol use: Not Currently    Drug use: Never     Social Determinants of Health     Transportation Needs: No Transportation Needs (09/15/2021)    PRAPARE - Transportation     Lack of Transportation (Medical): No     Lack of Transportation (Non-Medical): No         [facility] [place]  [phone]  Telephone [phone]  Fax [phone]

## 2022-12-06 NOTE — Unmapped (Signed)
RN contacted patient who reported knee surgery and had another MRI and the tissue has progressed and patient was wondering whether it was rheumatology related or mast-cell related. Patient had a repair earlier this year; she had a torn miniscus in patella area. Patient noted the current areas that are causing her issues including: Ankles, knees, feet. Patient noted she is still taking her Xolair every 3 weeks, is not taking her Allegra as frequently, and patient had stopped taking her Plaquenil. Patient was wanting to know if she could do a phone call to check in for further guidance, or if she needed a referral for Rheumatology to proceed forward.

## 2022-12-12 DIAGNOSIS — M25469 Effusion, unspecified knee: Principal | ICD-10-CM

## 2022-12-13 NOTE — Unmapped (Signed)
[facility] Specialty and Home Delivery Pharmacy Refill Coordination Note    Vanessa Hale, : [birth year]  Phone: [phone] (home)       All above HIPAA information was verified with patient.         12/12/2022    11:58 AM   Specialty Rx Medication Refill Questionnaire   Which Medications would you like refilled and shipped? Xolair 150mg  x 2   Please list all current allergies: Ceclor zpack   Have you missed any doses in the last 30 days? No   Have you had any changes to your medication(s) since your last refill? No   How many days remaining of each medication do you have at home? 0   If receiving an injectable medication, next injection date is 12/14/2022   Have you experienced any side effects in the last 30 days? No   Please enter the full address (street address, city, state, zip code) where you would like your medication(s) to be delivered to. [address] , [place] [address]   Please specify on which day you would like your medication(s) to arrive. Note: if you need your medication(s) within 3 days, please call the pharmacy to schedule your order at [phone]  12/14/2022   Has your insurance changed since your last refill? No   Would you like a pharmacist to call you to discuss your medication(s)? No   Do you require a signature for your package? (Note: if we are billing Medicare Part B or your order contains a controlled substance, we will require a signature) No         Completed refill call assessment today to schedule patient's medication shipment from the [facility] and Home Delivery Pharmacy [phone]).  All relevant notes have been reviewed.       Confirmed patient received a Conservation officer, historic buildings and a Surveyor, mining with first shipment. The patient will receive a drug information handout for each medication shipped and additional FDA Medication Guides as required.         REFERRAL TO PHARMACIST     Referral to the pharmacist: Not needed      [facility]     Shipping address confirmed in Epic.     Delivery Scheduled: Yes, Expected medication delivery date: 12/15/22.     Medication will be delivered via Same Day Courier to the prescription address in Epic WAM.    Vanessa Hale' W [name] [facility] Delivery Pharmacy Specialty Technician

## 2022-12-14 ENCOUNTER — Ambulatory Visit: Admit: 2022-12-14 | Discharge: 2022-12-15 | Payer: PRIVATE HEALTH INSURANCE

## 2022-12-14 DIAGNOSIS — M25469 Effusion, unspecified knee: Principal | ICD-10-CM

## 2022-12-14 LAB — C-REACTIVE PROTEIN: C-REACTIVE PROTEIN: <5 mg/L (ref <9.9)

## 2022-12-14 LAB — RHEUMATOID FACTOR, QUANT: RHEUMATOID FACTOR: 8.7 [IU]/mL (ref <14.0)

## 2022-12-14 LAB — SEDIMENTATION RATE: ERYTHROCYTE SEDIMENTATION RATE: 11 mm/h (ref 0–30)

## 2022-12-15 MED FILL — XOLAIR 150 MG/ML SUBCUTANEOUS SYRINGE: SUBCUTANEOUS | 21 days supply | Qty: 2 | Fill #11

## 2022-12-22 DIAGNOSIS — F909 Attention-deficit hyperactivity disorder, unspecified type: Principal | ICD-10-CM

## 2022-12-22 MED ORDER — LISDEXAMFETAMINE 40 MG CAPSULE
ORAL_CAPSULE | Freq: Every morning | ORAL | 0 refills | 30 days | Status: CP
Start: 2022-12-22 — End: ?

## 2022-12-27 DIAGNOSIS — F988 Other specified behavioral and emotional disorders with onset usually occurring in childhood and adolescence: Principal | ICD-10-CM

## 2022-12-27 MED ORDER — DEXTROAMPHETAMINE-AMPHETAMINE 20 MG TABLET
ORAL_TABLET | Freq: Every day | ORAL | 0 refills | 30 days | Status: CP
Start: 2022-12-27 — End: 2023-12-27

## 2023-01-01 DIAGNOSIS — L501 Idiopathic urticaria: Principal | ICD-10-CM

## 2023-01-01 DIAGNOSIS — D4709 Other mast cell neoplasms of uncertain behavior: Principal | ICD-10-CM

## 2023-01-01 MED ORDER — XOLAIR 150 MG/ML SUBCUTANEOUS SYRINGE
SUBCUTANEOUS | 11 refills | 21 days
Start: 2023-01-01 — End: ?

## 2023-01-02 MED ORDER — XOLAIR 150 MG/ML SUBCUTANEOUS SYRINGE
SUBCUTANEOUS | 11 refills | 21 days | Status: CP
Start: 2023-01-02 — End: ?
  Filled 2023-01-09: qty 2, 21d supply, fill #0

## 2023-01-03 NOTE — Unmapped (Signed)
[facility] Specialty and Home Delivery Pharmacy Refill Coordination Note    Vanessa Hale, [place]: Apr 20, 1985  Phone: [phone] (home)       All above HIPAA information was verified with patient.         01/03/2023     6:25 AM   Specialty Rx Medication Refill Questionnaire   Which Medications would you like refilled and shipped? Xolair 150 mg x 2   Please list all current allergies: Ceclor, zpack   Have you missed any doses in the last 30 days? No   Have you had any changes to your medication(s) since your last refill? No   How many days remaining of each medication do you have at home? 0   If receiving an injectable medication, next injection date is 01/09/2023   Have you experienced any side effects in the last 30 days? No   Please enter the full address (street address, city, state, zip code) where you would like your medication(s) to be delivered to. [address] [facility] [place] [address] [place]   Please specify on which day you would like your medication(s) to arrive. Note: if you need your medication(s) within 3 days, please call the pharmacy to schedule your order at [phone]  01/09/2023   Has your insurance changed since your last refill? No   Would you like a pharmacist to call you to discuss your medication(s)? No   Do you require a signature for your package? (Note: if we are billing Medicare Part B or your order contains a controlled substance, we will require a signature) No         Completed refill call assessment today to schedule patient's medication shipment from the [facility] Specialty and Home Delivery Pharmacy [phone]).  All relevant notes have been reviewed.       Confirmed patient received a Conservation officer, historic buildings and a Surveyor, mining with first shipment. The patient will receive a drug information handout for each medication shipped and additional FDA Medication Guides as required.         REFERRAL TO PHARMACIST     Referral to the pharmacist: Not needed      [facility]     Shipping address confirmed in Epic.     Delivery Scheduled: Yes, Expected medication delivery date: 01/09/23.     Medication will be delivered via Same Day Courier to the prescription address in Epic WAM.    Rodarius Kichline' W [name] Special[facility] Home Delivery Pharmacy Specialty Technician

## 2023-01-16 ENCOUNTER — Ambulatory Visit: Admit: 2023-01-16 | Discharge: 2023-01-17 | Payer: BLUE CROSS/BLUE SHIELD

## 2023-01-16 DIAGNOSIS — G8929 Other chronic pain: Principal | ICD-10-CM

## 2023-01-16 DIAGNOSIS — M25561 Pain in right knee: Principal | ICD-10-CM

## 2023-01-16 DIAGNOSIS — M1711 Unilateral primary osteoarthritis, right knee: Principal | ICD-10-CM

## 2023-01-16 NOTE — Unmapped (Signed)
SPORTS MEDICINE NEW VISIT    ASSESSMENT      Vanessa Hale is a 37 y.o. female  with:  1.  S/p right medial meniscus repair at OSH in 06/2022 with persistent pain    PLAN:    -- We had a thorough discussion today with the patient regarding her right knee pain.  She has a prior history of medial meniscus repair with recurrence of her symptoms.  Her pain is primarily over the anteromedial joint line.  We reviewed her MRI which demonstrates significant degenerative cartilage changes at the anterior medial femoral condyle.  She also has a potential tear of the anterior horn of her medial meniscus.  At this stage, recommend a trial of an intra-articular corticosteroid injection.  We also recommend initiation of a medial unloader brace. We will plan to follow-up in 2 months for repeat evaluation and discussion of next steps. She expressed her understanding and agreement with this plan.     --Imaging findings, further diagnostic and therapeutic options were reviewed with the patient, along with the benefits and drawbacks of each, and the patient expressed understanding    Prescriptions today: DME (medial unloader brace)    Follow-up: 2 months    X-rays at next visit:  None    DME ORDER:  Dx: M17.11, Localized osteoarthritis of right knee  Location: [facility] Location: Hip and Knee  Hip and Knee Orthotics: Medial Unloader  Laterality: Right    DME Lower Extremity, The patient was prescribed this orthosis The patient is ambulatory but has weakness and/or instability of their lower extremity which requires stabilization from this semi-rigid/ rigid orthosis to improve their function.      Procedure  After discussion of risks and benefits, at the patient's request, we provided her with a Right knee corticosteroid injection.  Under standard sterile conditions, the affected knee was injected with a mixture of 4 mL ropivacaine and 1 mL of 40 mg/mL Kenalog.  She tolerated the injection without complication. SUBJECTIVE       History of Present Illness: 37 y.o. female who presents for right knee pain.  She reports that approximately 1 year ago she began to have pain in the right knee.  She denies any known history of trauma.  The pain was primarily at the anterior aspect of the knee both medially and laterally.  The pain was worse with activity.  She eventually had an MRI which demonstrated a meniscus tear.  She was treated at an outside hospital with a right knee arthroscopy and meniscus repair in 06/2022.  She reports that he is short period of improvement after the procedure.  She completed therapy.  For the last few months, her pain has been worsening.  She now states that it is nearly as painful as it was prior to surgery.  She continues to have swelling in the knee.  Her pain is primarily at the anteromedial aspect of the knee.  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  She continues to limp.  She has not had any interval trauma.    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  Lives in [place].  Medical history significant for mast cell disease.      Medical History  Past Medical History:   Diagnosis Date    ADD (attention deficit disorder) without hyperactivity     GERD (gastroesophageal reflux disease)     Mast cell disease  Surgical History  Past Surgical History:   Procedure Laterality Date    ADENOIDECTOMY      CESAREAN SECTION, CLASSIC      HYSTERECTOMY      partial    LIVER BIOPSY      PR BIOPSY SOFT TISSUE NECK/CHEST Right 02/15/2021    Procedure: BIOPSY, SOFT TISSUE OF NECK OR THORAX;  Surgeon: Karrie Meres, MD;  Location: MAIN [facility];  Service: ENT    SINUS SURGERY      THYROID SURGERY      biopsy    TONSILLECTOMY      Medications  has a current medication list which includes the following prescription(s): dextroamphetamine-amphetamine, epinephrine, ibuprofen, and xolair.   Tobacco/Alcohol History  Social History Tobacco Use   Smoking Status Never   Smokeless Tobacco Never     Social History     Substance and Sexual Activity   Alcohol Use Not Currently    Social History        Occupational History    Not on file       Home Address:  [address]  [place] [address] Family History  Family History   Problem Relation Age of Onset    Alcohol abuse Neg Hx     Depression Neg Hx     Drug abuse Neg Hx     Mental illness Neg Hx         Allergies   Cefaclor, Azithromycin, and Reglan [metoclopramide hcl]       Review of Systems  .   Marland Kitchen  No chest pain, dyspnea, nausea, fevers, chills         OBJECTIVE     Physical Exam:    DETAILED PHYSICAL EXAM  General Appearance well-nourished and no acute distress   Vitals Estimated body mass index is 33.13 kg/m?? as calculated from the following:    Height as of 11/22/22: 160 cm (5' 3).    Weight as of 11/22/22: 84.8 kg (187 lb).   Mood and Affect alert, cooperative, and pleasant       Cardiovascular well-perfused distally and no swelling     MUSCULOSKELETAL   RIGHT:      KNEE: Gait: Reciprocal heel-toe gait. Antalgic on the right.  Range of motion:0-125  Effusion / swelling: Moderate effusion  Stability: Stable Lachman, negative anterior/posterior drawer. Stable to varus/valgus stress at 0 and 30 degrees of flexion.  Tenderness: Tender to palpation at the anteromedial aspect of the knee  Provocative test: Positive flexion pinch.  Positive McMurray.         Imaging/other tests:   The below imaging was reviewed and interpreted personally and with Dr. Olga Millers:  Right knee MRI from 11/27/22 with evidence of significant cartilage damage at the anterior aspect of the medial femoral condyle.  Possible tear of the anterior horn of the medial meniscus.    Orthopaedic PROMIS:  Failed to redirect to the Timeline version of the REVFS SmartLink.         No data to display                PRO Status:  Incomplete.       ADMINISTRATIVE     I have personally reviewed and interpreted the images (as available).  I have personally reviewed prior records and incorporated relevant information above (as available).    @[email or web address] @    PATIENT PROFILE     Practice: new to practice  Plan: Continued conservative management  PROCEDURES     Procedures     DME     DME ORDER:  Dx:  ,

## 2023-01-25 NOTE — Unmapped (Signed)
Upcoming Appt:  Future Appointments   Date Time Provider Department Center   01/26/2023 11:30 AM Claudine Mouton, MD [facility]   02/21/2023  1:00 PM Libby Maw, FNP [facility]   03/20/2023  1:30 PM Tomasa Rand, MD [facility]       Is this a pediatric patient?   No   Any recent, relevant visit?   No-- has a thyroid nodule that they monitor.     Any interventions?   Will rest occasionally  Reason for Disposition   [1] Blurred vision or visual changes AND [2] gradual onset (e.g., weeks, months)    Answer Assessment - Initial Assessment Questions  1. DESCRIPTION: How has your vision changed? (e.g., complete vision loss, blurred vision, double vision, floaters, etc.)      Pt reports blurry vision-- It seems like there is a haze over the eyes  2. LOCATION: One or both eyes? If one, ask: Which eye?      Both eyes  3. SEVERITY: Can you see anything? If Yes, ask: What can you see? (e.g., fine print)      Able to see-worse with distance-- wears glasses- has asigmatism   4. ONSET: When did this begin? Did it start suddenly or has this been gradual?      The past week or so-- came on gradually  5. PATTERN: Does this come and go, or has it been constant since it started?      Comes and goes  6. PAIN: Is there any pain in your eye(s)?  (Scale 1-10; or mild, moderate, severe)      No  7. CONTACTS-GLASSES: Do you wear contacts or glasses?      Glasses- for driving  8. CAUSE: What do you think is causing this visual problem?      Pt thinks it's due to blood sugar  9. OTHER SYMPTOMS: Do you have any other symptoms? (e.g., confusion, headache, arm or leg weakness, speech problems)      Denies all of the above. Reports occasional tiredness-mild- that comes and goes also.  She says her blood pressure has been normal for her, and that's not a concern- it runs low.   10. PREGNANCY: Is there any chance you are pregnant? When was your last menstrual period?        No    Pt also says she has been monitoring her blood sugar- her fasting normally is in the 70's- this past week has been 100, 109.  She is not diabetic- and doesn't take any medications for blood sugar.      Pt was told by the [facility]- that they made an appointment for her on 11/25--it's not on the schedule- RN offered to warm transfer back to the office- and she declined. Pt said she will call them.    Protocols used: Vision Loss or Change-A-AH

## 2023-01-25 NOTE — Unmapped (Signed)
Copied from CRM [id number]. Topic: Scheduling - New Appointment  >> Jan 25, 2023  2:00 PM April T wrote:  Caller asked to schedule a new appointment  Scheduled 01-29-23 with any provider  Declined to come in tomm    High blood sugar-[facility]

## 2023-01-25 NOTE — Unmapped (Signed)
Regarding: TX: Rayel Santizo-High Blood Sugar  ----- Message from April T sent at 01/25/2023  2:05 PM EST -----  High blood sugar

## 2023-01-26 ENCOUNTER — Ambulatory Visit: Admit: 2023-01-26 | Discharge: 2023-01-27 | Payer: BLUE CROSS/BLUE SHIELD

## 2023-01-26 DIAGNOSIS — J329 Chronic sinusitis, unspecified: Principal | ICD-10-CM

## 2023-01-26 DIAGNOSIS — D4709 Other mast cell neoplasms of uncertain behavior: Principal | ICD-10-CM

## 2023-01-26 DIAGNOSIS — L501 Idiopathic urticaria: Principal | ICD-10-CM

## 2023-01-26 DIAGNOSIS — J4530 Mild persistent asthma, uncomplicated: Principal | ICD-10-CM

## 2023-01-26 MED ORDER — XOLAIR 150 MG/ML SUBCUTANEOUS SYRINGE
SUBCUTANEOUS | 12 refills | 28 days | Status: CP
Start: 2023-01-26 — End: ?
  Filled 2023-02-06: qty 4, 28d supply, fill #0

## 2023-01-26 NOTE — Unmapped (Signed)
[facility]  [address]  [address], [address]  [place], [place] [address]    Chief complaint: follow up CIU, mast cell activation syndrome (MCAS)    Assessment and Plan:   Vanessa Hale is a 37 y.o. woman with probable mast cell activation syndrome and CIU who was seen in follow up for CIU and MCAS. Unfortunately, she is currently in a flare where her symptoms are not well controlled.    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 It is not clear that Vanessa Hale's breakthrough symptoms leading up to her next Xolair dose coincide with stopping the Plaquenil or if she is currently in a flare; we discussed options for management moving forward, including increasing her frequency of Xolair dosing (every 2 weeks), restarting Plaquenil, or using anti-histamines more regularly. She prefers to try Xolair every 2 weeks and understands this will require another insurance approval process. We discussed that if this is denied, she will restart Plaquenil. She is hesitant to restart Allegra regularly because of unwanted side effects (It makes me feel bad).    We discussed the natural history of MCAS and CIU. Unfortunately, these are like many chronic diseases in that the severity tends to wax and wane, sometimes for no discernable reason. We discussed how I do have some patients who have become refractory to our usual treatments, even after being well-controlled. However, and thankfully, Vanessa Hale is not at this point yet.    Vanessa Hale meets 3 of 3 WHO diagnostic criteria for mast cell activation syndrome (MCAS). She has had good control of MCAS symptoms (including cardiac/autonomic nervous system issues) when she was taking Plaquenil and Xolair every 3 weeks; now since she has been off Plaquenil (or maybe not related to this at all), she has been having more breakthrough symptoms.     - Increase to: Xolair 300 mg every 2 weeks - re-send for insurance approval of new interval of therapy  - Continue Allegra 360 mg BID PRN, Pepcid 20 mg BID PRN, Benadryl PRN  - OK to hold hydroxychloroquine/Plaquenil 200 mg BID   - If still uncontrolled, can try Hydroxyzine, alternate long-acting anti-histamine  - Failed: Singulair, high-dose anti-histamines, Xolair therapy alone (without Plaquenil)     Medication reaction (Reglan): This could be a reaction due to direct mast cell activation or from an allergy to this medication itself. She will continue avoiding this medication.    Chronic rhinosinusitis: Controlled. Co-managed by ENT Junius Finner, MD).   - Continue nasal sinus rinses  - Continue Nasocort    Nasal sores: Persistent but stable and manageable.     Mild persistent asthma versus reactive airway disease; controlled: Minimal symptoms with no changes made today.  - Continue Breo 100-25 (fluticasone/vilanterol) mcg/actuation 1 puff QD with spacer as-needed  - Continue albuterol PRN and 15-30 minutes prior to exercise  - Discussed vocal cord dysfunction, encouraged breathing exercises  - Consider additional causes if difficult to control: medication non-adherence, GERD, neurogenic cough, AERD, ABPA, occupational exposure, vocal cord dysfunction.      Return in about 3 months (around 04/28/2023) for Recheck CIU on higher dose Xolair.    I personally spent 35 minutes face-to-face and non-face-to-face in the care of this patient, which includes all pre, intra, and post visit time on the date of service.  All documented time was specific to the E/M visit and does not include any procedures that  may have been performed.       No orders of the defined types were placed in this encounter.      Claudine Mouton, MD, PhD  Allergy and Immunology    Subjective:   Last clinic visit: 09/09/2021    HISTORY OF PRESENT ILLNESS:  Vanessa Hale is a 37 y.o. woman with anxiety, CIU, and ?mast cell disorder, who presented in follow up for CIU and probable mast cell disorder. The patient is accompanied today by her mother and both appear to be reliable historians. I independently reviewed available records, which are summarized below.    Hasn't taken hydroxychloroquine for awhile (6 months?) to see if she could stop it. Tried to stop Xolair, went 5 weeks but could tell she needed it. Then after giving herself the next dose, felt much better. Thinks Xolair does wear off after 2 weeks. Has more flushing episodes, itching and hives develop when these breakthrough symptoms happen. Not taking Allegra or other antihistamines regularly, because they make her not feel well. Cause drowsiness, brain fog, trouble concentration.    No issues with Xolair shots, tolerating well. Thought maybe hair was thinning after starting it, but has been evaluated for this.    At last knee surgery was given Reglan pre-op, almost immediately started feeling weird. Had flushing, couldn't talk (I couldn't say words) and was confused. Developed hives. Was given IV Benadryl, symptoms resolved.    ROS: Pertinent positive and negatives included in the above HPI. All other systems reviewed are negative.    Prior visit and interval records:  From last visit with me: Doing ok but has been off Xolair for 7 weeks now, pharmacy did not think she was due for her next shot. Noticed she can feel a difference off the Xolair, feels more swollen, having more breakthrough hvies. Hasn't restarted Allegra, mostly hasn't needed it when on Plaquenil and Xolair together. Not having issues giving herself Xolair at home (but issues getting it delivered to give every 3 weeks). Addition of Plaquenil really helped, especially with her episodes of hypotension/tachycardia. Was on vacation in [place] recently and noticed laying out in the sun *helped* her skin a bit. Now in the heat and off Xolair, she is having more swelling/hives.    Noticing more chest tightness off Xolair with exertion. Not pre-treating with albuterol.    Recently had dilated retina exam, does yearly. Interested in checking tryptase now she has been off Xolair.    Saw ENT, mentioned possible septoplasty but patient isn't interested in this right now. Has been using rinses and topical bactroban, which has helped some. She can breathe through her nose and is not that bothered by these symptoms (enough to undergo surgery).    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 But is looking into LTACs for possible jobs. 100 year old son is having lots of allergy issues. Otherwise family is well.      External records reviewed:   N/A    Past Medical/Surgical/Allergy/Immunization/Social/Family History:  Reviewed and updated in Epic since last visit on 09/09/2021.    Current Outpatient Medications   Medication Sig Dispense Refill    dextroamphetamine-amphetamine (ADDERALL) 20 mg tablet Take 1.5 tablets (30 mg total) by mouth daily. 45 tablet 0    EPINEPHrine (EPIPEN) 0.3 mg/0.3 mL injection Inject 0.3 mL (0.3 mg total) into the muscle every five (5) minutes as needed for anaphylaxis. 2 each 3    ibuprofen (MOTRIN) 800 MG tablet Take 1 tablet (  800 mg total) by mouth Three (3) times a day.      omalizumab (XOLAIR) 150 mg/mL syringe Inject 2 mL (300 mg total) under the skin every fourteen (14) days. 4 mL 12    VYVANSE 40 mg cap capsule  (Patient not taking: Reported on 01/26/2023)       No current facility-administered medications for this visit.     Vanessa Hale lives with her family (husband, 3 children). She is in school to become a respiratory therapist. Still enjoying school, she is starting clinical rotations soon.    Objective:   PHYSICAL EXAM:   Vitals: BP 114/74 (BP Site: L Arm, BP Position: Sitting)  - Pulse 88  - Ht 157.5 cm (5' 2)  - Wt 84.6 kg (186 lb 6.4 oz)  - SpO2 98%  - BMI 34.09 kg/m??   Constitutional: Young woman sitting comfortably in no acute distress. Pleasant, conversant.  Head, Eyes, Ears, Nose, Throat, Neck: Sclera anicteric, conjunctivae non-injected. Mild allergic shiners.  Respiratory: Normal work of breathing on room air. Able to speak in full sentences without difficulty.  Musculoskeletal: Normal muscle bulk and tone. Spontaneously moving all extremities without difficulty.  Skin: No eczema or swelling. Warm and well perfused without cyanosis, clubbing, or edema. Mild erythema (splotchy) of neck present during visit, which migrated frequently.   Neurologic: Awake, alert and oriented to person, place, and time.  Psychiatric: Normal affect and mood. Thought process linear.    Laboratory testing independently reviewed and interpreted below:  Component      Latest Ref Rng 04/07/2022 04/12/2022 11/22/2022 12/14/2022   WBC      3.6 - 11.2 10*9/L   7.8     RBC      3.95 - 5.13 10*12/L   4.60     HGB      11.3 - 14.9 g/dL   16.1     HCT      09.6 - 44.0 %   41.8     MCV      77.6 - 95.7 fL   90.8     MCH      25.9 - 32.4 pg   31.1     MCHC      32.0 - 36.0 g/dL   04.5     RDW      40.9 - 15.2 %   12.8     MPV      6.8 - 10.7 fL   8.6     Platelet      150 - 450 10*9/L   295     nRBC      <=4 /100 WBCs   0     Neutrophils %      %   56.3     Lymphocytes %      %   32.2     Monocytes %      %   8.0     Eosinophils %      %   2.5     Basophils %      %   1.0     Absolute Neutrophils      1.8 - 7.8 10*9/L   4.4     Absolute Lymphocytes      1.1 - 3.6 10*9/L   2.5     Absolute Monocytes       0.3 - 0.8 10*9/L   0.6     Absolute Eosinophils      0.0 - 0.5 10*9/L  0.2     Absolute Basophils       0.0 - 0.1 10*9/L   0.1     Sodium      135 - 145 mmol/L   137     Potassium      3.5 - 5.0 mmol/L   4.4     Chloride      98 - 107 mmol/L   98     CO2      22.0 - 32.0 mmol/L   28.0     Anion Gap      7 - 15 mmol/L   11     Bun      7 - 21 mg/dL   19     Creatinine      0.60 - 1.00 mg/dL   1.61     BUN/Creatinine Ratio   27     eGFR CKD-EPI (2021) Female      >=60 mL/min/1.71m2   >90     Glucose      74 - 106 mg/dL   75     Calcium      8.5 - 10.2 mg/dL   9.5     Albumin      3.4 - 5.0 g/dL   4.6     Total Protein      6.5 - 8.3 g/dL   7.4     Total Bilirubin      0.1 - 1.2 mg/dL   1.4 (H)     SGOT (AST)      14 - 38 U/L   35     ALT      <35 U/L   40 (H)     Alkaline Phosphatase      38 - 126 U/L   84     Hemoglobin A1c      4.8 - 5.6 % 4.8       Estimated Average Glucose      mg/dL 91       TSH      0.960 - 3.300 uIU/mL 0.567 (L)   1.910     Free T4      0.89 - 1.76 ng/dL 4.54       Vitamin D Total (25OH)      20.0 - 80.0 ng/mL 30.7       Thyroid Peroxidase Ab      <=60 U/mL  40      Rheumatoid Factor      <14.0 IU/mL    8.7    Sed Rate      0 - 30 mm/h    11    CRP      <9.9 mg/L    <5.0       Legend:  (L) Low  (H) High    Interpretation: CBC w/diff is WNL. CMP showing mildly elevated Tbili and ALT. TSH slightly low but free T4 WNL. Vitamin D level is WNL. TPO antibody is WNL. Autoimmune serology and inflammatory markers are WNL.    Imaging independently reviewed and interpreted:  Impression   Mildly heterogeneous thyroid, with right mid TI-RADS 3 nodule measuring up to 1.1 cm. No further follow up needed, as nodule is less than 1.5 cm in size.      ________________________________   TI-RADS 1 (0 points): Benign- No FNA indication   TI-RADS 2 (2 points): Not suspicious- No FNA indicated   TI-RADS 3 (3 points): Mildly suspicious- FNA is > or = 2.5 cm, follow if >  or = 1.5 cm   TI-RADS 4 (4-6 points): Moderately suspicious- FNA if > or = 1.5 or follow if > or = 1.0 cm   TI-RADS 5 (7 or more points): Highly suspicious- FNA if >=1.0 cm, follow if >=0.5 cm   NOTE:  The TI-RADS classification of thyroid nodules has been adopted to standardize risk stratification based on a common lexicon to inform practitioners about which nodules warrant biopsy.  The imaging criteria for TI-RADS criteria and documentation are available online at [email or web address].arnold.com/               Please see below for data measurements:         Right thyroid: Sagittal 4.6 cm; AP 1.8 cm; Transverse 1.7 cm   Left thyroid: Sagittal 4.9 cm; AP 1.5 cm; Transverse 1.9 cm      Isthmus: 0.4 cm     Interpretation: Agree with radiologist's read, although unable to personally interpret this imaging study.    Testing performed in clinic today:  Not indicated.

## 2023-01-26 NOTE — Unmapped (Addendum)
[facility]  [address]  5th Floor, [address]  [place], [place] [address]        Your provider today was: Claudine Mouton (Attending)    It was a pleasure seeing you again today! Here is your plan:    Let's try increasing how often you get your Xolair shots to every 2 weeks instead of every 3 weeks. Hopefully this will give you extra coverage so that you don't have symptoms coming back before you are due for your next shot.    If this doesn't work, or Electrical engineer does not approve this new dose of Xolair, then we will plan to restart your Plaquenil.    Thank you for letting us be involved with your medical care!    Contact Information:      Appointments and Referrals   [phone]     Emergencies, refills, medical questions    [phone]) [phone]  Ask for the Allergy/Immunology Doctor on call     You can also use [email or web address] ([email or web address] to request refills, access test results, and send questions to your doctor! However, you should expect to get a response within 2-3 days after sending your message (and not immediately). If it is an urgent issue, please contact the on call physician (see above).       This handout was modified from the [facility] website, accessed on 02/18/2021: [email or web address]    Chronic Hives (Chronic Idiopathic Urticaria, CIU) with or without Angioedema  Chronic hives (chronic urticaria) are red, itchy skin welts that last more than six weeks. Many people have these welts every day for a year or longer. People with certain autoimmune diseases are more prone to chronic hives. But often, the cause of chronic hives is unknown. Antihistamines, steroids and immunosuppressants can soothe the hives. Chronic hives can also be associated with a kind of swelling called angioedema. Hives and swelling don't always have to happen together, but if they do they are probably caused by the same process (and are treated the same way). This handout will focus on hives, but keep in mind that this also applies to hives WITH angioedema, too.      What are chronic hives (chronic idiopathic urticaria)?  Chronic hives are itchy, raised, red bumps or welts that appear on your skin at least twice per week. These welts, also called wheals, are chronic when they last more than six weeks.    You may hear this condition called chronic idiopathic urticaria:  Chronic: A symptom or condition that persists even with treatment.  Idiopathic: The symptom or condition occurs for no known reason. It comes on suddenly.  Urticaria: The medical term for hives, pronounced ur-tik-CARE-ee-uh.    What are hives (urticaria) and angioedema (swelling)?  Hives (urticaria) and angioedema (swelling) can occur together. Hives are raised red bumps or splotches on your skin. Angioedema (swelling) may occur in deep layers of tissue. This swelling can affect your face, lips, throat, hands, feet and genitals.    What???s the difference between hives and chronic hives?  Chronic hives are different than acute hives:  Acute hives start to fade within 24 hours (although new hives may replace them). They are gone within six weeks. A recent viral infection often causes acute hives.  Chronic hives are visible at least twice per week for more than six weeks. Some chronic hives last for months or years. The cause is often unknown.    How common are chronic hives?  Up to 5% of people develop chronic hives. The condition affects all ages and genders but is more common in women ages 11 to 43.      What causes chronic hives?  For most people with chronic hives, there???s no known cause. Other types of allergies may also bring on chronic hives.    Some people develop chronic hives when their body changes temperature rapidly due to heat, cold or physical activity. Pressure on your skin from tight clothing may also cause the condition.    What autoimmune diseases cause chronic hives?  About 1 in 5 people who develop chronic hives also have an autoimmune disease, such as:  Celiac disease.  Dermatomyositis.  Diabetes.  Lupus.  Polymyositis.  Rheumatoid arthritis.  Thyroid disease.  Vitiligo.    What other diseases cause chronic hives?  Other conditions that may cause chronic hives include:  Asthma.  Infections like H. pylori bacterial infections (a stomach infection that causes bad heartburn) and sinus infections (sinusitis).  Liver disease.  Lymphomas, including Hodgkin lymphoma and non-Hodgkin???s lymphoma.  Vasculitis.    What are chronic hive symptoms?  Hives can appear anywhere on your body and look different on each person. Hives can have different shapes and sizes. They may be as small as a pinprick or larger than a softball.    Chronic hive symptoms include:  Red, raised welts or bumps on your skin that may hurt or sting.  Blanching (the center of the hive turns white when you press it).  Itchy skin (pruritis).  Swelling (angioedema).      What type of doctor should I see for chronic hives?  Healthcare providers who diagnose and treat chronic hives include:  Allergists.  Dermatologists.  Primary care physicians.    How are chronic hives (chronic urticaria) diagnosed?  Your healthcare provider can diagnose chronic hives by examining your skin and learning more about your symptoms.    Diagnostic tests for chronic hives can pinpoint or rule out causes. You may get one or more of these tests:  Blood test to check for high levels of antibodies, proteins that help your body fight off bacteria, allergens and other substances.  Urine test to look for bacterial infections.  Skin biopsy procedure to confirm the diagnosis and evaluate for other causes.    You may have noticed that allergy testing is NOT included in the list of tests used to diagnose chronic hives. Chronic hives is virtually never caused by an allergy (sensitization and a hypersensitivity reaction to environmental allergens, such as pollen, molds, dust mites or animal danders). Allergy testing and lab tests normally used to diagnose allergies will therefore not be helpful.      What are chronic hives treatments?  Treatments for chronic hives include:  Allergy medications: Daily over-the-counter (OTC) or prescription allergy medications like antihistamines relieve itching and reduce or prevent allergic reactions.  Allergy shots: A monthly injection of a drug called omalizumab (Xolair??) blocks your body???s production of immunoglobin E (IgE). People with severe allergies can make too much IgE, leading to problems like chronic hives and asthma.  Steroids: Corticosteroids like prednisone (Deltasone??, Rayos??) can ease symptoms that don???t respond to allergy medicines.  Hydroxychloroquine: A study found that 8 in 10 people with autoimmune disease-induced chronic hives got symptom relief after taking hydroxychloroquine (Plaquenil??), an antimalarial drug, for three or more months.  Cyclosporine: This immunosuppressant is highly effective at clearing up severe chronic hives. But it can cause serious side effects when taken for  too long.    What are at-home treatments for chronic hives?  You can try these steps at home to ease itchy skin and soothe inflammation:  Apply an OTC anti-itch cream.  Place cool compresses on the hives several times a day (unless cool temperatures make hives worse).  Take a cool bath or shower.  Use hypoallergenic lotions and creams to moisturize dry skin.  Wear loose-fitting clothes made with soft fabric that won???t irritate your skin.      Can chronic hives last for years?  For about half of people with chronic hives, the hives go away (often without treatment) within a year. Treatments can ease symptoms of long-lasting hives.      When should I call the doctor?  Call your healthcare provider if you experience:  Hives or swelling that lasts more than a week.  Infected-looking bumps (red, swollen or pus-filled).  Recurring hives that come back every few months.  Severely itchy skin.    What should I ask my provider?  You may want to ask your healthcare provider:  What is causing the chronic hives?  When should the chronic hives go away?  Should I get an allergy test?  What???s the best treatment for chronic hives?  What???s the best treatment to reduce itching?  Should I look out for signs of complications?      Can stress or anxiety cause chronic hives?  Stress and anxiety can worsen skin diseases. Anti-anxiety medicines may help. You may also benefit from stress-relieving therapies like mindfulness or meditation.    A note from [facility]  Chronic hives (chronic urticaria) can be itchy and uncomfortable. You may become self-conscious about your appearance. Most of the time, providers can???t pinpoint the cause of chronic hives. However, treatments like antihistamines, steroids and even immunosuppressants can help. You can also take steps at home to ease itching and swelling. For many people, chronic hives eventually go away, although it may take a year or longer.             Mast Cell Activation Syndrome (MCAS)    The following information was modified from an American Academy of Allergy, Asthma, and Immunology (AAAAI) website: http://morrow-smith.net/.     Mast cells are allergy cells responsible for immediate allergic reactions. They cause allergic symptoms by releasing chemicals called ???mediators??? stored inside them (made before) or made at the time of an allergic reaction (takes a little longer for these mediators to be made and cause allergies). In allergic reactions, mediators are released when the allergy antibody IgE, which is present on the mast cell surfaces, binds to proteins that cause allergies (called allergens). This triggering is called activation, and the release of these mediators is called degranulation. Some of these mediators are stored in granules in the mast cells and are released quickly and others are made slowly only after the cell has been triggered.           Degranulation is shown below:      Mast cells can be activated by other things, too, such as medicines, infections, and insect or reptile venoms. These responses, while they make you feel icky, are normal and help protect you from poisons and parasites and other germs. This specific process is called ???secondary activation??? because the mast cells are activated by something other than the IgE allergy antibody.    Sometimes mast cells don't work correctly and release mediators because of incorrect signals they give themselves. Certain mutations in mast cells  can produce a bunch of identical mast cells (like twins!) - called clones - that make too much of the mediators and release them for no good reason. When this happens, it is called ???primary activation???. These abnormal mast cells can grow and grow and grow and are (for some reason) very sensitive to activation. This is a condition called mastocytosis.    Idiopathic Mast Cell Activation Syndrome (MCAS)    MCAS is a condition in which the patient has allergic reactions over and over again, but without eating a food they are allergic to, or without taking a medicine that can cause secondary activation of mast cells. The allergic reactions have symptoms such as hives (welps, whelps, welts), swelling, low blood pressure, trouble breathing, belly pain, bad diarrhea, feeling like you are going to throw up (nausea) or actually throwing up (vomiting), racing heart, or feeling lightheaded (or passing out, falling out). When this happens, lots and lots of mast cell mediators are released. Such a severe reaction is called anaphylaxis. Thankfully, these allergic reactions get better when you are treated with medicines that block mast cell mediators. For instance, one mediator is histamine, and the medicines we use to help stop an allergic reaction, like Benadryl and Zyrtec, are called anti-histamines because they block histamine from working.     We call this condition ???idiopathic??? because we don't know why it happens - that is, the allergic reactions are not caused by allergic antibody or other things that activate normal mast cells.    Evaluation for MCAS    Diagnostic Criteria: Need all 3 criteria for a diagnosis of MCAS.    The first step is to listen to you talk about your symptoms when you have a reaction. Your symptoms should follow some basic rules. They should:  Happen in separate attacks, with time in between them when you feel well  Be symptoms that we expect in anaphylaxis  Not have another clear cause or reason for happening    You must get better after using medicines that block mast cell mediators (like Benadryl or EpiPen).    Measured mast cell mediators in your blood and/or urine (pee) will be higher than normal, because these mediators are released and increase a lot during an allergic reaction. We measure these mediators both when you are having anaphylaxis and when you are not having anaphylaxis. This way we can tell if the levels of mediators go up during a reaction. If the levels are high even without a reaction or go up during a reaction, you may have MCAS.    Symptoms    Anaphylaxis is a specific type of severe allergic reaction that must involve symptoms from at least 2 different organs (e.g., heart and skin, lungs and skin, heart and gut). Symptoms that can occur during anaphylaxis are:    Heart-related symptoms:   Fast heart beat (tachycardia)  Low blood pressure (hypotension)  Feeling light-headed or passing out (pre-syncope or syncope)    Skin-related symptoms:   Itching (pruritus)  Hives (urticaria or welts)  Swelling (angioedema)  Skin that turns red and hot (flushing)    Lung-related symptoms:   Cough  Wheezing  Trouble breathing  Being short of breath  Noisy breathing (a harsh noise when breathing, stridor) that occurs with throat swelling    Gastrointestinal tract (gut) symptoms:   Nausea +/- vomiting  Crampy belly pain  Diarrhea    Mediators and Tests for MCAS    Mast cells are known to make a  lot of mediators (a mediator being a chemical that acts on another part of the body to cause typical allergic symptoms), but only of them or their breakdown products (metabolites) are reliably high in these allergic episodes and can be measured in laboratory tests. The only useful tests for diagnosing MCAS are serum (blood) mast cell tryptase and in urine (pee) N-methylhistamine, 2,3 dinor-11-beta-Prostaglandin F2-alpha (11B-PGF2?) and/or Leukotriene E4 (LTE4).    Blood for the serum mast cell tryptase test needs to be collected between 30 minutes and 2 hours after the start of an allergic reaction. Blood for the baseline (NOT during an episode) mast cell tryptase test is collected many days later.     Urine (pee) for the N-methylhistamine, 11B-PGF2?, and LTE4 tests are collected for 24 hours, starting immediately after an episode starts.    These are special laboratory tests, with special rules for the samples to make sure the test is run correctly. Patients should work with their Environmental education officer, who can communicate with emergency and lab personnel, to assure the tests are ordered and collected appropriately.    Treatment    Treatment helps you feel better, but it is also needed to diagnose MCAS! If you don't feel better with treatments, you probably do not have MCAS.    The treatment of allergic reaction episodes when they are happening is the same as anyone who is having an allergic reaction / anaphylaxis. Treatments may include epinephrine (EpiPen, Auvi-Q, or other epinephrine auto-injector), anti-histamines (Benadryl / diphenhydramine, Zyrtec / cetirizine, Pepcid / famotidine), steroids (prednisone, Orapred / prednisolone, dexamethasone, methylprednisolone), IV fluids, breathing treatments (albuterol, inhaled racemic epinephrine, albuterol with Atrovent / ipratropium), or help breathing with machines (non-invasive ventilation: BiPAP or CPAP machines; or invasive ventilation: breathing tube or breathing machine, also called intubation).    Anti-histamines, such as Benadryl / diphenhydramine and Atarax / Vistaril / hydroxyzine, can help with itching, stomach pain, and flushing, but they can cause side effects (sleepiness, dry mouth). Other anti-histamines, including Claritin / loratadine, Zyrtec / cetirizine, Allegra / fexofenadine, Xyzal / levocetirizine, and Clarinex / desloratadine, have fewer side effects but might not help with itching as much.    Another kind of anti-histamine is famotidine / Pepcid. This can help belly pain and nausea.    Aspirin stops your body from making a certain mediator called prostaglandin D2 and can decrease flushing.    Other medicines also block other mediators. Singulair / montelukast and Accolate / zafirlukast stop leukotriene C4 (LTC4) from working. Zyflo / zileuton stops LTC4 from being made. These medicines help with wheezing and stomach cramping.     Steroids (also called corticosteroids or glucocorticoids) are helpful for swelling, hives, and wheezing, but they do not work quickly. Thus, it should not be one of the first medicines you use to treat your symptoms.    Xolair / omalizumab stops IgE allergic antibody from sticking to the outside of mast cells. Because of this, Xolair lowers mast cell sensitivity to activation, which can stop anaphylaxis from happening before it starts.    Summary    Since anaphylaxis can look a lot like other conditions not caused by mast cells, we use 3 diagnostic criteria that you must have to qualify for a diagnosis of MCAS. If you are diagnosed with MCAS, you may need extra tests to look for certain kinds of MCAS (like when you have clones of one mast cell that are growing out of control). These tests may include a simple blood test or a  bone marrow biopsy. If these tests are all negative, then you are said to have idiopathic mast cell activation syndrome.    For more information:      http://morrow-smith.net/       [email or web address].[email or web address]      [email or web address]          Mast cell activation disorder      When mast cells are stimulated, they release sacks of chemicals that include the chemical histamine into your skin and other parts of your body. These chemicals can cause itchy skin and the throat closing / difficulty breathing sensation when they are dumped into your skin, lung, and airways. They cause you to feel dizzy and lightheaded and sometimes pass out if they are released into the blood stream or near blood vessels.      Mast Cell Activation Syndrome (MCAS) Management Plan for  Vanessa Hale  Printed: 01/26/2023          Avoid Known Triggers:   Foods:    N/A  Environmental Factors:    Strong scents, aeroallergens, cigarette smoke  Medications:   Opiates, non-steroidal anti-inflammatory drugs (NSAID)  Other:   Alcohol, vigorous exercise    Make your next allergy appointment with Dr. Claudine Mouton in : 6 months.    The phone number is [phone] or you can request an appointment in My Chart.    GREEN ZONE   DOING WELL. No symptoms at all or mild symptoms. Able to do usual activities.    Take these Daily Maintenance medications  Antihistamines  Fexofenadine (Allegra)  Hydroxyzine (Atarax, Vistaril)  Famotidine (Pepcid)  Mast Cell Stabilizers  Leukotriene receptor / pathway antagonists  Supplements (no data)  Immunomodulators (no data)   - took previously but not taking currentlyChemotherapeutics (no data)  Biologics (no data)  Omalizumab (Xolair) - 300 mg injection every 2 weeks    Please take all other daily medications as prescribed to help control other medical conditions you may have (which can trigger MCAS flares or be mistaken for MCAS symptoms): Asthma - Breo Ellipta inhaler (1 puff daily), albuterol rescue inhaler when needed    Environmental allergies - Nasacort nasal spray, nasal / sinus rinses    POTS - As prescribed by your POTS doctors/health care providers.    YELLOW ZONE   MCAS symptoms are GETTING WORSE. You have all or any of the following symptoms, but you can still do some activities.    MOUTH/FACE: itching, swelling of lips and/or tongue:   SKIN: itching, hives, redness, swelling  GUT: vomiting, diarrhea, cramps    1st Step - Take Quick Relief medicine below.  If known, and if possible, stop or remove the item that triggered the flare.     Quick-Relief Antihistamines  Hydroxyzine (Vistaril, Atarax) - take up to 50 mg every 4 hours as needed. No more than 400 mg in 24 hours.  Famotidine (Pepcid) - 20 mg dose. Take up to 4 times in one day as needed.    If symptoms improve: take Quick-Relief medicines for 1 extra day (24 extra hours) and then stop and just take your GREEN ZONE medications.    If symptoms are not better within 72 hours (3 days) on these medicines:     1) During business hours, call Allergy/Immunology Office at [phone]. During nonbusiness hours, call [facility] operator at [phone] and ask for the Allergy / Immunology Doctor on Call.  Continue to take GREEN ZONE medications.       Once symptoms get better, you  can stop your YELLOW ZONE quick-relief medications. Continue to take GREEN ZONE medications:       RED ZONE   MCAS flare is VERY BAD. You are having ANAPHYLAXIS.  This means--    You have 1 or more of the following:  THROAT: itching, tightness/closure, very hoarse  LUNGS: Short of breath, can't stop coughing, wheezing, trouble talking or walking.  HEART/BLOOD PRESSURE: weak pulse, dizziness, passing out    AND you have 1 or more YELLOW ZONE symptoms:  MOUTH/FACE: itching, swelling of lips and/or tongue:   SKIN: itching, hives, redness, swelling  GUT: vomiting, diarrhea, cramps    1) Inject epinephrine in thigh.  EpiPen / Generic Epipen Autoinjector  If no improvement after 5 minutes, give an additional dose of epinephrine  If medication is not working (no improvement within 5 minutes after the last dose of epinephrine), Call 911 / Go to the Emergency Department.     2) If medication is working and you get relief, call  [phone] and ask for the Allergy / Immunology Doctor on Call to update them and get guidance on any additional YELLOW ZONE medications to take and whether you need to go to Emergency Department.

## 2023-01-29 NOTE — Unmapped (Signed)
I performed a history and physical examination of the patient and   discussed the patient's management with the Resident. I reviewed   the Resident's note and agree with the documented findings and plan   of care.

## 2023-01-30 NOTE — Unmapped (Signed)
Clinical Assessment Needed For: Dose Change  Medication: Xolair  Last Fill Date/Day Supply: 01/09/23 / 21  Copay $0  Was previous dose already scheduled to fill: No    Notes to Pharmacist: None

## 2023-02-01 NOTE — Unmapped (Signed)
[facility] Clinical Assessment & Refill Coordination Note    Vanessa Hale, DOB: Aug 01, 1985  Phone: [phone] (home)     All above HIPAA information was verified with patient.     Was a Nurse, learning disability used for this call? No    Specialty Medication(s):   Inflammatory Disorders: Xolair     Current Outpatient Medications   Medication Sig Dispense Refill    dextroamphetamine-amphetamine (ADDERALL) 20 mg tablet Take 1.5 tablets (30 mg total) by mouth daily. 45 tablet 0    EPINEPHrine (EPIPEN) 0.3 mg/0.3 mL injection Inject 0.3 mL (0.3 mg total) into the muscle every five (5) minutes as needed for anaphylaxis. 2 each 3    ibuprofen (MOTRIN) 800 MG tablet Take 1 tablet (800 mg total) by mouth Three (3) times a day.      omalizumab (XOLAIR) 150 mg/mL syringe Inject 2 mL (300 mg total) under the skin every fourteen (14) days. 4 mL 12    VYVANSE 40 mg cap capsule  (Patient not taking: Reported on 01/26/2023)       No current facility-administered medications for this visit.        Changes to medications: [name] reports no changes at this time.    Allergies   Allergen Reactions    Cefaclor Hives    Azithromycin Nausea And Vomiting    Reglan [Metoclopramide Hcl] Hives, Dizziness and Confusion       Changes to allergies: No    SPECIALTY MEDICATION ADHERENCE     Xolair 150 mg/ml: 0 doses of medicine on hand     Medication Adherence    Patient reported X missed doses in the last month: 0  Specialty Medication: Xolair 150 mg/mL  Patient is on additional specialty medications: No  Patient is on more than two specialty medications: No  Any gaps in refill history greater than 2 weeks in the last 3 months: no  Demonstrates understanding of importance of adherence: yes  Informant: patient          Specialty medication(s) dose(s) confirmed: Patient reports changes to the regimen as follows: Dose increased to 300mg  every 14 days      Are there any concerns with adherence? No    Adherence counseling provided? Not needed    CLINICAL MANAGEMENT AND INTERVENTION      Clinical Benefit Assessment:    Do you feel the medicine is effective or helping your condition? Yes but has mild breakthrough hives     Clinical Benefit counseling provided? Not needed - Provider is aware and increased dose to 300mg  every 14 days    Adverse Effects Assessment:    Are you experiencing any side effects? No    Are you experiencing difficulty administering your medicine? No    Quality of Life Assessment:    Quality of Life    Rheumatology  Oncology  Dermatology  Cystic Fibrosis          How many days over the past month did your urticaria  keep you from your normal activities? For example, brushing your teeth or getting up in the morning. Patient declined to answer    Have you discussed this with your provider? Not needed    Acute Infection Status:    Acute infections noted within Epic:  No active infections  Patient reported infection: None    Therapy Appropriateness:    Is therapy appropriate based on current medication list, adverse reactions, adherence, clinical benefit and progress toward achieving therapeutic goals?  Yes, therapy is appropriate and should be continued     DISEASE/MEDICATION-SPECIFIC INFORMATION      For patients on injectable medications: Patient currently has 0 doses left.  Next injection is scheduled for ASAP.    Chronic Inflammatory Diseases: Have you experienced any flares in the last month? Yes, flares are most intense closer to when next Xolair dose is due  Has this been reported to your provider? Yes, Xolair frequency increased to every 14 days    PATIENT SPECIFIC NEEDS     Does the patient have any physical, cognitive, or cultural barriers? No    Is the patient high risk? No    Did the patient require a clinical intervention? No    Does the patient require physician intervention or other additional services (i.e., nutrition, smoking cessation, social work)? No    SOCIAL DETERMINANTS OF HEALTH     At the [facility], we have learned that life circumstances - like trouble affording food, housing, utilities, or transportation can affect the health of many of our patients.   That is why we wanted to ask: are you currently experiencing any life circumstances that are negatively impacting your health and/or quality of life? Patient declined to answer    Social Drivers of Health     Food Insecurity: Not on file   Internet Connectivity: Not on file   Housing/Utilities: Not on file   Tobacco Use: Low Risk  (01/26/2023)    Patient History     Smoking Tobacco Use: Never     Smokeless Tobacco Use: Never     Passive Exposure: Not on file   Transportation Needs: No Transportation Needs (09/15/2021)    PRAPARE - Transportation     Lack of Transportation (Medical): No     Lack of Transportation (Non-Medical): No   Alcohol Use: Not At Risk (09/15/2021)    Alcohol Use     How often do you have a drink containing alcohol?: Monthly or less     How many drinks containing alcohol do you have on a typical day when you are drinking?: 1 - 2     How often do you have 5 or more drinks on one occasion?: Never   Interpersonal Safety: Not on file   Physical Activity: Not on file   Intimate Partner Violence: Not on file   Stress: Not on file   Substance Use: Not on file (01/10/2023)   Social Connections: Not on file   Financial Resource Strain: Not on file   Depression: Not at risk (11/22/2022)    PHQ-2     PHQ-2 Score: 0   Health Literacy: Not on file       Would you be willing to receive help with any of the needs that you have identified today? Not applicable       SHIPPING     Specialty Medication(s) to be Shipped:   Inflammatory Disorders: Xolair    Other medication(s) to be shipped: No additional medications requested for fill at this time     Changes to insurance: No    Delivery Scheduled: Yes, Expected medication delivery date: 02/07/23.     Medication will be delivered via UPS to the confirmed prescription address in [facility].    The patient will receive a drug information handout for each medication shipped and additional FDA Medication Guides as required.  Verified that patient has previously received a Conservation officer, historic buildings and a Surveyor, mining.    The patient or  caregiver noted above participated in the development of this care plan and knows that they can request review of or adjustments to the care plan at any time.      All of the patient's questions and concerns have been addressed.    Oliva Bustard, PharmD   [facility] Specialty Pharmacist

## 2023-02-04 DIAGNOSIS — F988 Other specified behavioral and emotional disorders with onset usually occurring in childhood and adolescence: Principal | ICD-10-CM

## 2023-02-04 MED ORDER — DEXTROAMPHETAMINE-AMPHETAMINE 20 MG TABLET
ORAL_TABLET | Freq: Every day | ORAL | 0 refills | 30 days
Start: 2023-02-04 — End: 2024-02-04

## 2023-02-07 MED ORDER — DEXTROAMPHETAMINE-AMPHETAMINE 20 MG TABLET
ORAL_TABLET | Freq: Every day | ORAL | 0 refills | 30 days | Status: CP
Start: 2023-02-07 — End: 2024-02-07

## 2023-02-21 ENCOUNTER — Ambulatory Visit: Admit: 2023-02-21 | Discharge: 2023-02-22 | Payer: BLUE CROSS/BLUE SHIELD | Attending: Family | Primary: Family

## 2023-02-21 DIAGNOSIS — D4709 Other mast cell neoplasms of uncertain behavior: Principal | ICD-10-CM

## 2023-02-21 DIAGNOSIS — F900 Attention-deficit hyperactivity disorder, predominantly inattentive type: Principal | ICD-10-CM

## 2023-02-21 NOTE — Unmapped (Signed)
[facility]     Assessment/Plan     Vanessa Hale was seen today for follow-up.    Diagnoses and all orders for this visit:    Attention deficit hyperactivity disorder (ADHD), predominantly inattentive type  37 year old female patient presents today for routine 75-month follow-up.  Currently treated for ADHD.  Has had difficulty getting Vyvanse.  Prefers to stay on Adderall for now.  Does not need refills    Mast cell disease  Follows with allergist for her mast cell disease.  Currently on Xolair and having to increase to every other week.  Hopes to not have to go back on Plaquenil    Patient response, barriers, and adherence to medication have been identified and addressed. Barriers to the treatment plan have been identified and addressed with patient. Unhealthy behaviors have been noted and addressed as appropriate. Patient voiced understanding and all questions have been answered to satisfaction. Return in about 3 months (around 05/22/2023) for adhd.    ORDERS THIS VISIT     No orders of the defined types were placed in this encounter.     No results found for this visit on 02/21/23.  New Prescriptions    No medications on file      Modified Medications    No medications on file      Discontinued Medications    VYVANSE 40 MG CAP CAPSULE            Subjective:   Time seen  February 21, 2023 1:21 PM    Vanessa Hale, Vanessa Hale, Vanessa Hale    Vanessa Hale, is a 37 y.o. female    HPI:  Patient presents today for routine 60-month follow-up of ADHD.  Today she complains of no problems  Had second opinion regarding her right knee pain (s/p arthroscopy)  Had a sterid injection and now without pain of the right knee   has an un-loader brace to wear while working     has not worn a lot    Taking adderall due to difficulty filling Vyvanse    Had follow up with allergist and taking xolair every two weeks now    the effects are wearing off faster.   Will go back to plaquenil if things don't get better (does not want to due to possible visual changes).       Has lost a few inches.   Got a pellaton bike    limited with exercise due to her knee pain.       The following portions of the patient's history were reviewed and updated as appropriate: allergies, current medications, past family history, past medical history, past social history, past surgical history and problem list.    ROS:   A 12 point review of systems was conducted with negative results except as noted in the HPI.     Objective:     Physical Exam  BP 120/62 (BP Site: L Arm, BP Position: Sitting, BP Cuff Size: X-Large)  - Pulse 68  - Temp 35.6 ??C (96 ??F) (Temporal)  - Resp 20  - Wt 84.9 kg (187 lb 3.2 oz)  - SpO2 98%  - BMI 34.24 kg/m??   General-  Normal appearing female in no apparent distress.  Neurologic- Alert and oriented X3.  Cranial nerve II-XII grossly intact. No focal abnormality.  HEENT-  Normocephalic atraumatic head.  No scleral icterus.    Neck- Supple, JVD normal.,   Respiratory- respirations even and unlabored,  Lungs clear to  auscultation a/p, no wheezes, rhonchi, or rales.  Cardiovascular  heart rhythm regular. Murmur not present.  Gastrointestinal- Soft,   MSK-  No deformity, No clubbing or cyanosis. No edema, pulses intact   Psych- Normal mood, appropriate.  Integumentary - skin warm, dry, and intact    Current Outpatient Medications   Medication Sig Dispense Refill    dextroamphetamine-amphetamine (ADDERALL) 20 mg tablet Take 1.5 tablets (30 mg total) by mouth daily. 45 tablet 0    EPINEPHrine (EPIPEN) 0.3 mg/0.3 mL injection Inject 0.3 mL (0.3 mg total) into the muscle every five (5) minutes as needed for anaphylaxis. 2 each 3    ibuprofen (MOTRIN) 800 MG tablet Take 1 tablet (800 mg total) by mouth Three (3) times a day.      omalizumab (XOLAIR) 150 mg/mL syringe Inject the contents of 2 syringes (300 mg total) under the skin every fourteen (14) days. 4 mL 12     No current facility-administered medications for this visit.       Past Medical History:   Diagnosis Date    ADD (attention deficit disorder) without hyperactivity     GERD (gastroesophageal reflux disease)     Mast cell disease        Past Surgical History:   Procedure Laterality Date    ADENOIDECTOMY      CESAREAN SECTION, CLASSIC      HYSTERECTOMY      partial    LIVER BIOPSY      PR BIOPSY SOFT TISSUE NECK/CHEST Right 02/15/2021    Procedure: BIOPSY, SOFT TISSUE OF NECK OR THORAX;  Surgeon: Karrie Meres, MD;  Location: MAIN OR [facility];  Service: ENT    SINUS SURGERY      THYROID SURGERY      biopsy    TONSILLECTOMY         Social History     Socioeconomic History    Marital status: Married   Tobacco Use    Smoking status: Never    Smokeless tobacco: Never   Vaping Use    Vaping status: Never Used   Substance and Sexual Activity    Alcohol use: Not Currently    Drug use: Never     Social Drivers of Health     Transportation Needs: No Transportation Needs (09/15/2021)    PRAPARE - Transportation     Lack of Transportation (Medical): No     Lack of Transportation (Non-Medical): No         [facility] [place]  [phone]  Telephone [phone]  Fax [phone]

## 2023-02-27 NOTE — Unmapped (Signed)
[facility] Refill Coordination Note    Vanessa Hale, [place]: 01/21/1986  Phone: [phone] (home)       All above HIPAA information was verified with patient.         02/26/2023     8:10 AM   Specialty Rx Medication Refill Questionnaire   Which Medications would you like refilled and shipped? Xolair 150 mg x 4 i have none on hand   Please list all current allergies: Zpack, ceclor   Have you missed any doses in the last 30 days? No   Have you had any changes to your medication(s) since your last refill? No   How many days remaining of each medication do you have at home? 0   If receiving an injectable medication, next injection date is 03/07/2023   Have you experienced any side effects in the last 30 days? No   Please enter the full address (street address, city, state, zip code) where you would like your medication(s) to be delivered to. [address] [facility] [place] [address]   Please specify on which day you would like your medication(s) to arrive. Note: if you need your medication(s) within 3 days, please call the pharmacy to schedule your order at [phone]  03/05/2023   Has your insurance changed since your last refill? No   Would you like a pharmacist to call you to discuss your medication(s)? No   Do you require a signature for your package? (Note: if we are billing Medicare Part B or your order contains a controlled substance, we will require a signature) No         Completed refill call assessment today to schedule patient's medication shipment from the [facility] [phone]).  All relevant notes have been reviewed.       Confirmed patient received a Conservation officer, historic buildings and a Surveyor, mining with first shipment. The patient will receive a drug information handout for each medication shipped and additional FDA Medication Guides as required.         REFERRAL TO PHARMACIST     Referral to the pharmacist: Not needed      [facility] Shipping address confirmed in Epic.     Delivery Scheduled: Yes, Expected medication delivery date: 03/05/2023.     Medication will be delivered via Same Day Courier to the prescription address in Epic WAM.    Emmalou Hunger [facility] Specialty Technician

## 2023-03-05 MED FILL — XOLAIR 150 MG/ML SUBCUTANEOUS SYRINGE: SUBCUTANEOUS | 28 days supply | Qty: 4 | Fill #1

## 2023-03-12 DIAGNOSIS — F988 Other specified behavioral and emotional disorders with onset usually occurring in childhood and adolescence: Principal | ICD-10-CM

## 2023-03-12 MED ORDER — DEXTROAMPHETAMINE-AMPHETAMINE 20 MG TABLET
ORAL_TABLET | Freq: Every day | ORAL | 0 refills | 30.00 days | Status: CP
Start: 2023-03-12 — End: 2024-03-11

## 2023-03-12 NOTE — Unmapped (Signed)
Patient is requesting the following refill  Requested Prescriptions     Pending Prescriptions Disp Refills    dextroamphetamine-amphetamine (ADDERALL) 20 mg tablet 45 tablet 0     Sig: Take 1.5 tablets (30 mg total) by mouth daily.       See PDMP    Recent Visits  Date Type Provider Dept   02/21/23 Office Visit Libby Maw, FNP [place] [facility]   11/22/22 Office Visit Libby Maw, [place] [facility]   04/07/22 Office Visit Libby Maw, [place] [facility]   Showing recent visits within past 365 days and meeting all other requirements  Future Appointments  Date Type Provider Dept   05/23/23 Appointment Libby Maw, FNP [place] [facility]   Showing future appointments within next 365 days and meeting all other requirements       Opioid Monitoring   Urine Tox Screen Last Drug Screen Date: Not Found  Opiate Confirmation Test Last Drug Screen Date: Not Found  Last Opioid Dispensed Provider: Theophilus Bones, MD  Prescribed MEDD : 0  Last PDMP Review: 02/05/2023 10:03 AM  Last Opioid Pain Agreement Signed Date: Not Found  Last Non Opioid Controlled Substance Pain Agreement Signed Date: Not Found    Naloxone Ordered: Not Found  Last OV: 02/21/2023

## 2023-03-20 ENCOUNTER — Ambulatory Visit: Admit: 2023-03-20 | Discharge: 2023-03-21 | Payer: BLUE CROSS/BLUE SHIELD

## 2023-03-20 DIAGNOSIS — M25561 Pain in right knee: Principal | ICD-10-CM

## 2023-03-20 DIAGNOSIS — G8929 Other chronic pain: Principal | ICD-10-CM

## 2023-03-20 DIAGNOSIS — M1711 Unilateral primary osteoarthritis, right knee: Principal | ICD-10-CM

## 2023-03-21 NOTE — Unmapped (Signed)
SPORTS MEDICINE RETURN VISIT    ASSESSMENT      Bettina Broden is a 38 y.o. female  with:  1.  S/p right medial meniscus repair at [facility]SH in 06/2022 with persistent pain    PLAN:    -- We had a good discussion in clinic today with Priscillia in regard to her right knee.  She has had significant improvement in her right knee pain since we last saw her and she had the injection.  We talked about her known arthritis and that the relief she is having may not last.  If she were to have recurrence of her right knee pain we recommend utilizing the medial unloader brace that she received after her last visit as well as following up for reevaluation.  We discussed that in the setting of recurrent knee pain, if she has good relief with use of the brace, she would likely benefit from an osteotomy to offload her medial compartment.  She can follow-up in clinic as needed and we recommend that she follow-up if she has recurrence of her right knee pain.    --Imaging findings, further diagnostic and therapeutic options were reviewed with the patient, along with the benefits and drawbacks of each, and the patient expressed understanding    Prescriptions today: None    Follow-up: As needed    X-rays at next visit:  None      SUBJECTIVE       History of Present Illness: 37 y.o. female who presents for follow-up of right knee pain.  She was last seen in our clinic in November which when she was provided with the medial unloader brace and provided with a steroid injection to the right knee.  After about a week and a half she had resolution of the right knee pain.  She has been able to return to work as well as her desired level of activity without any issues.  She briefly tried did the medial unloader brace but found it uncomfortable and has not used it since November.    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  Lives in [place].  Medical history significant for mast cell disease.      Medical History  Past Medical History:   Diagnosis Date    ADD (attention deficit disorder) without hyperactivity     GERD (gastroesophageal reflux disease)     Mast cell disease     Surgical History  Past Surgical History:   Procedure Laterality Date    ADENOIDECTOMY      CESAREAN SECTION, CLASSIC      HYSTERECTOMY      partial    LIVER BIOPSY      PR BIOPSY SOFT TISSUE NECK/CHEST Right 02/15/2021    Procedure: BIOPSY, SOFT TISSUE OF NECK OR THORAX;  Surgeon: Karrie Meres, MD;  Location: MA[facility] OR [facility];  Service: ENT    SINUS SURGERY      THYROID SURGERY      biopsy    TONSILLECTOMY      Medications  has a current medication list which includes the following prescription(s): dextroamphetamine-amphetamine, epinephrine, ibuprofen, and xolair.   Tobacco/Alcohol History  Social History     Tobacco Use   Smoking Status Never   Smokeless Tobacco Never     Social History     Substance and Sexual Activity   Alcohol Use Not Currently    Social History  Occupational History    Not on file       Home Address:  [address]  [place] [address] Family History  Family History   Problem Relation Age of Onset    Alcohol abuse Neg Hx     Depression Neg Hx     Drug abuse Neg Hx     Mental illness Neg Hx         Allergies   Cefaclor, Azithromycin, and Reglan [metoclopramide hcl]       Review of Systems  .   Marland Kitchen  No chest pain, dyspnea, nausea, fevers, chills         OBJECTIVE     Physical Exam:    DETAILED PHYSICAL EXAM  General Appearance well-nourished and no acute distress   Vitals Estimated body mass index is 34.24 kg/m?? as calculated from the following:    Height as of 01/26/23: 157.5 cm (5' 2).    Weight as of 02/21/23: 84.9 kg (187 lb 3.2 oz).   Mood and Affect alert, cooperative, and pleasant       Cardiovascular well-perfused distally and no swelling     MUSCULOSKELETAL   RIGHT:      KNEE:   Range of motion:0-130  Effusion / swelling: Trace effusion  .         Imaging/other tests: No new imaging    Orthopaedic PROMIS:  Failed to redirect to the Timeline version of the REVFS SmartLink.         No data to display                PRO Status:  Incomplete.       ADMINISTRATIVE     I have personally reviewed and interpreted the images (as available).  I have personally reviewed prior records and incorporated relevant information above (as available).    @[email or web address] @    PATIENT PROFILE     Practice: established to provider  Plan: Continued conservative management     PROCEDURES     Procedures     DME     DME ORDER:  Dx:  ,

## 2023-03-21 NOTE — Unmapped (Signed)
 I performed a history and physical examination of the patient and   discussed the patient's management with the Resident. I reviewed   the Resident's note and agree with the documented findings and plan   of care.

## 2023-04-03 NOTE — Unmapped (Signed)
[facility] Refill Coordination Note    Vanessa Hale, Vanessa Hale: [birth year]  Phone: [phone] (home)       All above HIPAA information was verified with patient.         04/03/2023     2:57 PM   Specialty Rx Medication Refill Questionnaire   Which Medications would you like refilled and shipped? Xolair 150mg  x 2   Please list all current allergies: Ceclor zpack   Have you missed any doses in the last 30 days? No   Have you had any changes to your medication(s) since your last refill? No   How many days remaining of each medication do you have at home? 0   If receiving an injectable medication, next injection date is 04/06/2023   Have you experienced any side effects in the last 30 days? No   Please enter the full address (street address, city, state, zip code) where you would like your medication(s) to be delivered to. [address] [place] [address]   Please specify on which day you would like your medication(s) to arrive. Note: if you need your medication(s) within 3 days, please call the pharmacy to schedule your order at [phone]  04/06/2023   Has your insurance changed since your last refill? No   Would you like a pharmacist to call you to discuss your medication(s)? No   Do you require a signature for your package? (Note: if we are billing Medicare Part B or your order contains a controlled substance, we will require a signature) No         Completed refill call assessment today to schedule patient's medication shipment from the [facility] [phone]).  All relevant notes have been reviewed.       Confirmed patient received a Conservation officer, historic buildings and a Surveyor, mining with first shipment. The patient will receive a drug information handout for each medication shipped and additional FDA Medication Guides as required.         REFERRAL TO PHARMACIST     Referral to the pharmacist: Not needed      [facility]     Shipping address confirmed in Epic.     Delivery Scheduled: Yes, Expected medication delivery date: 04/06/23 .     Medication will be delivered via Same Day Courier to the prescription address in Epic WAM.    [name]   [facility] Specialty Technician

## 2023-04-06 MED FILL — XOLAIR 150 MG/ML SUBCUTANEOUS SYRINGE: SUBCUTANEOUS | 28 days supply | Qty: 4 | Fill #2

## 2023-04-14 DIAGNOSIS — F988 Other specified behavioral and emotional disorders with onset usually occurring in childhood and adolescence: Principal | ICD-10-CM

## 2023-04-14 MED ORDER — DEXTROAMPHETAMINE-AMPHETAMINE 20 MG TABLET
ORAL_TABLET | Freq: Every day | ORAL | 0 refills | 30.00 days
Start: 2023-04-14 — End: 2024-04-13

## 2023-04-16 MED ORDER — DEXTROAMPHETAMINE-AMPHETAMINE 20 MG TABLET
ORAL_TABLET | Freq: Every day | ORAL | 0 refills | 30.00 days | Status: CP
Start: 2023-04-16 — End: 2024-04-15

## 2023-04-16 NOTE — Unmapped (Signed)
Patient is requesting the following refill  Requested Prescriptions     Pending Prescriptions Disp Refills    dextroamphetamine-amphetamine (ADDERALL) 20 mg tablet 45 tablet 0     Sig: Take 1.5 tablets (30 mg total) by mouth daily.       Last refill given on: 03/12/2023 with 30 count and 0 refills.     Recent Visits  Date Type Provider Dept   02/21/23 Office Visit Libby Maw, [place] [facility]   11/22/22 Office Visit Libby Maw, [place] [facility]   Showing recent visits within past 365 days and meeting all other requirements  Future Appointments  Date Type Provider Dept   05/23/23 Appointment Libby Maw, FNP [place] [facility]   Showing future appointments within next 365 days and meeting all other requirements       Opioid Monitoring   Urine Tox Screen Last Drug Screen Date: Not Found  Opiate Confirmation Test Last Drug Screen Date: Not Found  Last Opioid Dispensed Provider: Theophilus Bones, MD  Prescribed MEDD : 0  Last PDMP Review: 03/12/2023  1:46 PM  Last Opioid Pain Agreement Signed Date: Not Found  Last Non Opioid Controlled Substance Pain Agreement Signed Date: Not Found    Naloxone Ordered: Not Found  Last OV: 02/21/2023

## 2023-04-19 ENCOUNTER — Ambulatory Visit: Admit: 2023-04-19 | Discharge: 2023-04-20 | Payer: BLUE CROSS/BLUE SHIELD

## 2023-04-19 ENCOUNTER — Inpatient Hospital Stay: Admit: 2023-04-19 | Discharge: 2023-04-20 | Payer: BLUE CROSS/BLUE SHIELD

## 2023-04-19 DIAGNOSIS — M1711 Unilateral primary osteoarthritis, right knee: Principal | ICD-10-CM

## 2023-04-19 DIAGNOSIS — G8929 Other chronic pain: Principal | ICD-10-CM

## 2023-04-19 DIAGNOSIS — M25561 Pain in right knee: Principal | ICD-10-CM

## 2023-04-19 NOTE — Unmapped (Signed)
SPORTS MEDICINE RETURN VISIT    ASSESSMENT      Vanessa Hale is a 38 y.o. female  with:  1.  S/p right medial meniscus repair at OSH in 06/2022 with persistent pain    PLAN:    -- We had a good discussion in clinic today with Vanessa Hale in regard to her right knee.  Unfortunately she has now developed recurrence of isolated medial sided knee pain.  Hip knee ankle x-rays were obtained today demonstrating weightbearing alignment through the medial compartment and progressive varus deformity asymmetric to the opposite knee.  Given the early wear of the medial compartment, surgical management would likely consist of high tibial osteotomy plus minus cartilage restoration procedure.  We discussed a trial of unloader bracing.  We discussed the surgery as well as its recovery.  At length.  She is going to let us know how she would like to proceed and we have stressed that pursuing surgical management should be symptom driven.  In addition information from the [employer] website regarding osteotomy and its recovery was provided.    --Imaging findings, further diagnostic and therapeutic options were reviewed with the patient, along with the benefits and drawbacks of each, and the patient expressed understanding    Prescriptions today: None    Follow-up: As needed    X-rays at next visit:  None      SUBJECTIVE       History of Present Illness: 38 y.o. female who presents for follow-up of right knee pain.  She was last seen in our clinic in November which when she was provided with the medial unloader brace and provided with a steroid injection to the right knee.  She done really well until recently.  She now has much more severe medial sided knee pain.  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  Lives in [place].  Medical history significant for mast cell disease.      Medical History  Past Medical History:   Diagnosis Date    ADD (attention deficit disorder) without hyperactivity     GERD (gastroesophageal reflux disease)     Mast cell disease     Surgical History  Past Surgical History:   Procedure Laterality Date    ADENOIDECTOMY      CESAREAN SECTION, CLASSIC      HYSTERECTOMY      partial    LIVER BIOPSY      PR BIOPSY SOFT TISSUE NECK/CHEST Right 02/15/2021    Procedure: BIOPSY, SOFT TISSUE OF NECK OR THORAX;  Surgeon: Karrie Meres, MD;  Location: MAIN OR [facility];  Service: ENT    SINUS SURGERY      THYROID SURGERY      biopsy    TONSILLECTOMY      Medications  has a current medication list which includes the following prescription(s): dextroamphetamine-amphetamine, epinephrine, ibuprofen, and xolair.   Tobacco/Alcohol History  Social History     Tobacco Use   Smoking Status Never   Smokeless Tobacco Never     Social History     Substance and Sexual Activity   Alcohol Use Not Currently    Social History        Occupational History    Not on file       Home Address:  [address]  [place] [address] Family  History  Family History   Problem Relation Age of Onset    Alcohol abuse Neg Hx     Depression Neg Hx     Drug abuse Neg Hx     Mental illness Neg Hx         Allergies   Cefaclor, Azithromycin, and Reglan [metoclopramide hcl]       Review of Systems  .   Marland Kitchen  No chest pain, dyspnea, nausea, fevers, chills         OBJECTIVE     Physical Exam:    DETAILED PHYSICAL EXAM  General Appearance well-nourished and no acute distress   Vitals Estimated body mass index is 34.24 kg/m?? as calculated from the following:    Height as of 01/26/23: 157.5 cm (5' 2).    Weight as of 02/21/23: 84.9 kg (187 lb 3.2 oz).   Mood and Affect alert, cooperative, and pleasant       Cardiovascular well-perfused distally and no swelling     MUSCULOSKELETAL   RIGHT:      KNEE:   Range of motion:0-130  Effusion / swelling: Trace effusion  .         Imaging/other tests: X-rays were obtained showing weightbearing axis through the medial compartment on the right knee versus neutral on the left.  There is early medial compartment narrowing.    Orthopaedic PROMIS:  Failed to redirect to the Timeline version of the REVFS SmartLink.         No data to display                PRO Status:  Incomplete.       ADMINISTRATIVE     I have personally reviewed and interpreted the images (as available).  I have personally reviewed prior records and incorporated relevant information above (as available).    @[email or web address] @    PATIENT PROFILE     Practice: established to provider  Plan: Continued conservative management     PROCEDURES     Procedures     DME     DME ORDER:  Dx:  ,

## 2023-04-30 NOTE — Unmapped (Signed)
 [facility] Pharm[facility] Refill Coordination Note    Vanessa Hale, [place]: 04/23/1985  Phone: [phone] (home)       All above HIPAA information was verified with patient.         04/27/2023     1:04 PM   Specialty Rx Medication Refill Questionnaire   Which Medications would you like refilled and shipped? Xolair 150mg    Please list all current allergies: Zpack and ceclor   Have you missed any doses in the last 30 days? No   Have you had any changes to your medication(s) since your last refill? No   How many days remaining of each medication do you have at home? 0   If receiving an injectable medication, next injection date is 05/03/2023   Have you experienced any side effects in the last 30 days? No   Please enter the full address (street address, city, state, zip code) where you would like your medication(s) to be delivered to. [address], [place], [place] [address]   Please specify on which day you would like your medication(s) to arrive. Note: if you need your medication(s) within 3 days, please call the pharmacy to schedule your order at [phone]  05/02/2023   Has your insurance changed since your last refill? No   Would you like a pharmacist to call you to discuss your medication(s)? No   Do you require a signature for your package? (Note: if we are billing Medicare Part B or your order contains a controlled substance, we will require a signature) No         Completed refill call assessment today to schedule patient's medication shipment from the [facility] [phone]).  All relevant notes have been reviewed.       Confirmed patient received a Conservation officer, historic buildings and a Surveyor, mining with first shipment. The patient will receive a drug information handout for each medication shipped and additional FDA Medication Guides as required.         REFERRAL TO PHARMACIST     Referral to the pharmacist: Not needed      [facility]     Shipping address confirmed in Epic.     Delivery Scheduled: Yes, Expected medication delivery date: 05/02/23 .     Medication will be delivered via Same Day Courier to the prescription address in Epic WAM.    Vanessa Hale   [facility] Specialty Technician

## 2023-05-02 MED FILL — XOLAIR 150 MG/ML SUBCUTANEOUS SYRINGE: SUBCUTANEOUS | 28 days supply | Qty: 4 | Fill #3

## 2023-05-13 DIAGNOSIS — F988 Other specified behavioral and emotional disorders with onset usually occurring in childhood and adolescence: Principal | ICD-10-CM

## 2023-05-13 MED ORDER — DEXTROAMPHETAMINE-AMPHETAMINE 20 MG TABLET
ORAL_TABLET | Freq: Every day | ORAL | 0 refills | 30.00 days
Start: 2023-05-13 — End: 2024-05-12

## 2023-05-14 MED ORDER — DEXTROAMPHETAMINE-AMPHETAMINE 20 MG TABLET
ORAL_TABLET | Freq: Every day | ORAL | 0 refills | 30.00 days | Status: CP
Start: 2023-05-14 — End: 2024-05-13

## 2023-05-14 NOTE — Unmapped (Signed)
 [facility]  [address]  [address], [address]  [place], [place] [address]    Chief complaint: follow up CIU, mast cell activation syndrome (MCAS)    Assessment and Plan:   Marizol Borror is a 38 y.o. woman with probable mast cell activation syndrome and CIU who was seen in follow up for CIU and MCAS. Unfortunately, she is currently in a flare where her symptoms are not well controlled.    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 I do suspect she may have some other condition(s) contributing to her episodes of swelling (rosacea) and pruritus/rash (dermatitis) that are not related to mast cells. However, I think it is reasonable to try an oral mast cell stabilizer to see if this helps reduce the frequency of flushing episodes; I also encouraged her to talk to her primary doctor about rosacea.  - Continue Xolair 300 mg every 2 weeks  - Continue Allegra 360 mg BID PRN, Pepcid 20 mg BID PRN, Benadryl PRN  - If still uncontrolled, can try Hydroxyzine, alternate long-acting anti-histamine  - Epinephrine autoinjector refill sent    -- Discussed epinephrine nasal spray, but patient is ok with autoinjector  - START cromolyn (Gastrocrom) 200 mg (10 mL) BID (slowly up to QID ACHS) ~30 minutes before meals to see if this helps flushing reactions  - Talk to PCP about rosacea  - Failed: Singulair, high-dose anti-histamines, Plaquenil     Dermatitis: The rash today is consistent with dermatitis, although if she continues having issues with this despite a higher-potency topical corticosteroid, then we could send to Dermatology for alternative diagnosis. I was initially concerned for hidradenitis suppurativa given localization, but this does not fit with her symptoms. Also considering an allergic or contact irritant dermatitis, folliculitis, seborrheic dermatitis.  - Continue hydrocortisone cream on face, neck, groin  - START triamcinolone 0.1% cream, apply BID to affected areas until clear  - Discussed good skin care regimen (avoid dyes/perfumes, use frequent bland emollients)  - Consider referral to Dermatology if doesn't respond to this treatment    Medication reaction (Reglan): This could be a reaction due to direct mast cell activation or from an allergy to this medication itself. She will continue avoiding this medication.    Chronic rhinosinusitis: Stable, mostly-controlled. It is encouraging that she has not been having sinus infections since being on omalizumab. Co-managed by ENT Junius Finner, MD).   - Continue nasal sinus rinses  - Continue Nasocort    Nasal sores: Persistent but stable and manageable. Considered that this was a side effect of omalizumab (can cause rhinitis), but this symptom predates her starting omalizumab, and she reports these sores return leading up to her next omalizumab dose - suggesting that the omalizumab is helping these. She will continue to monitor these symptoms.    Mild persistent asthma versus reactive airway disease; controlled: Minimal symptoms with no changes made today.  - Continue Breo 100-25 (fluticasone/vilanterol) mcg/actuation 1 puff QD with spacer as-needed  - Continue albuterol PRN and 15-30 minutes prior to exercise    -- Refill sent to patient's pharmacy today  - Rx for inhalational spacing device sent  - Discussed vocal cord dysfunction, encouraged breathing exercises  - Consider additional causes if difficult to control: medication non-adherence, GERD, neurogenic cough, AERD, ABPA, occupational exposure, vocal cord dysfunction.      Return in about 6 months (around 11/18/2023) for Recheck CIU/MCAS.  I personally spent 30 minutes face-to-face and non-face-to-face in the care of this patient, which includes all pre, intra, and post visit time on the date of service.  All documented time was specific to the E/M visit and does not include any procedures that may have been performed.       No orders of the defined types were placed in this encounter.      Claudine Mouton, MD, PhD  Allergy and Immunology    Subjective:   Last clinic visit: 01/26/2023     HISTORY OF PRESENT ILLNESS:  Valeta Paz is a 38 y.o. woman with anxiety, CIU, and ?mast cell disorder, who presented in follow up for CIU and probable mast cell disorder. The patient is accompanied today by her mother and both appear to be reliable historians. I independently reviewed available records, which are shown below.    Ms. Fleischhacker is doing ok since increasing frequency of omalizumab to every 2 weeks at last visit. Still will have occasional episodes of feet swelling, erythema, itching, if she walks or stands for awhile. Also having itchy rash under arms and on arms; difficulty shaving armpits. Start as small, red, itchy bumps. Scab over due to itching but are not hard bumps under the skin, do not drain purulence or clear fluid. Lasts days to weeks. Did have bug bite on right arm that got pretty big, lasted awhile and is still resolving. Using OTC hydrocortisone on these areas, which helps some. Notices some similar feeling bumps on scalp, around hairline (especially on neck) but without dandruff or greasiness. Does not excessively sweat under armpits. Has been trying different deodorants to see if any are better for sensitive skin. No issues in groin area.    Also has random episodes of flushing, where face and chest will get very hot and red. Sometimes happens when eating, although not reproducibly with the same food(s). Does have some small veins on her cheeks. Hasn't tried ketotifen or other oral mast cell stabilizer medicine.    Has persistent, chronic rhinitis that never really goes away. Using rinses when needed, Nasacort. Symptoms don't go away in winter. Still getting nasal sores, typically pop up just before next omalizumab dose. Not getting sinus infections - which is an improvement (previously had frequent sinus infections) since starting omalizumab. This rhinitis/nasal sores were an issue before starting omalizumab, but thinks overall that the omalizumab is helping with this issue.    Asthma is doing fine. Very infrequently needs albuterol. No longer taking Breo regularly, only when needed (and hasn't in awhile). Does not have spacer for albuterol, and albuterol is expired.    ROS: Pertinent positive and negatives included in the above HPI. All other systems reviewed are negative.    Prior visit and interval records:  From last visit with me: Hasn't taken hydroxychloroquine for awhile (6 months?) to see if she could stop it. Tried to stop Xolair, went 5 weeks but could tell she needed it. Then after giving herself the next dose, felt much better. Thinks Xolair does wear off after 2 weeks. Has more flushing episodes, itching and hives develop when these breakthrough symptoms happen. Not taking Allegra or other antihistamines regularly, because they make her not feel well. Cause drowsiness, brain fog, trouble concentration.    No issues with Xolair shots, tolerating well. Thought maybe hair was thinning after starting it, but has been evaluated for this.    At last knee surgery was given Reglan pre-op, almost immediately started feeling weird.  Had flushing, couldn't talk (I couldn't say words) and was confused. Developed hives. Was given IV Benadryl, symptoms resolved.      External records reviewed:   N/A    Past Medical/Surgical/Allergy/Immunization/Social/Family History:  Reviewed and updated in Epic since last visit on 01/26/2023.    Current Outpatient Medications   Medication Sig Dispense Refill    albuterol HFA 90 mcg/actuation inhaler Inhale 2-4 puffs every six (6) hours as needed for wheezing. 18 g 5    cromolyn (GASTROCROM) 100 mg/5 mL solution Take 10 mL (200 mg total) by mouth Four (4) times a day (before meals and nightly). 1200 mL 11    dextroamphetamine-amphetamine (ADDERALL) 20 mg tablet Take 1.5 tablets (30 mg total) by mouth daily. 45 tablet 0    EPINEPHrine (EPIPEN) 0.3 mg/0.3 mL injection Inject 0.3 mL (0.3 mg total) into the muscle every five (5) minutes as needed for anaphylaxis. 2 each 3    ibuprofen (MOTRIN) 800 MG tablet Take 1 tablet (800 mg total) by mouth Three (3) times a day.      inhalational spacing device Spcr 1 each by Miscellaneous route Take as directed. 1 each 0    omalizumab (XOLAIR) 150 mg/mL syringe Inject the contents of 2 syringes (300 mg total) under the skin every fourteen (14) days. 4 mL 12    triamcinolone (KENALOG) 0.1 % cream Apply topically two (2) times a day. 453.6 g 2     No current facility-administered medications for this visit.     Kidada lives with her family (husband, 3 children). She is a respiratory therapist.     Objective:   PHYSICAL EXAM:   Vitals: BP 115/75 (BP Site: L Arm, BP Position: Sitting)  - Pulse 76  - SpO2 99%   Constitutional: Young woman sitting comfortably in no acute distress. Pleasant, conversant.  Head, Eyes, Ears, Nose, Throat, Neck: Sclera anicteric, conjunctivae non-injected. Mild allergic shiners. Erythema on cheeks with few telangiectasias.   Respiratory: Normal work of breathing on room air. Able to speak in full sentences without difficulty.  Musculoskeletal: Normal muscle bulk and tone. Spontaneously moving all extremities without difficulty.  Skin: No swelling. Warm and well perfused without cyanosis, clubbing, or edema. Scattered erythematous papules in axillae and on forearms, some with rough scaling. No nodules, skin intact, no drainage.   Neurologic: Awake, alert and oriented to person, place, and time.  Psychiatric: Normal affect and mood. Thought process linear.    Laboratory testing independently reviewed and interpreted below:  None new since last visit available for personal review.    Imaging independently reviewed and interpreted:  None new or relevant to today's visit since last visit available for personal review.    Testing performed in clinic today:  Not indicated.

## 2023-05-14 NOTE — Unmapped (Signed)
 Patient is requesting the following refill  Requested Prescriptions     Pending Prescriptions Disp Refills    dextroamphetamine-amphetamine (ADDERALL) 20 mg tablet 45 tablet 0     Sig: Take 1.5 tablets (30 mg total) by mouth daily.       Last refill given on: 04/16/2023 with 45 count and 0 refills.     Recent Visits  Date Type Provider Dept   02/21/23 Office Visit Libby Maw, [place] [facility]   11/22/22 Office Visit Libby Maw, [place] [facility]   Showing recent visits within past 365 days and meeting all other requirements  Future Appointments  Date Type Provider Dept   05/23/23 Appointment Libby Maw, FNP [facility]   Showing future appointments within next 365 days and meeting all other requirements       Opioid Monitoring   Urine Tox Screen Last Drug Screen Date: Not Found  Opiate Confirmation Test Last Drug Screen Date: Not Found  Last Opioid Dispensed Provider: Theophilus Bones, MD  Prescribed MEDD : 0  Last PDMP Review: 04/16/2023 11:12 AM  Last Opioid Pain Agreement Signed Date: Not Found  Last Non Opioid Controlled Substance Pain Agreement Signed Date: Not Found    Naloxone Ordered: Not Found  Last OV: 02/21/2023

## 2023-05-18 ENCOUNTER — Ambulatory Visit: Admit: 2023-05-18 | Discharge: 2023-05-19 | Payer: BLUE CROSS/BLUE SHIELD

## 2023-05-18 DIAGNOSIS — D4709 Other mast cell neoplasms of uncertain behavior: Principal | ICD-10-CM

## 2023-05-18 DIAGNOSIS — J329 Chronic sinusitis, unspecified: Principal | ICD-10-CM

## 2023-05-18 DIAGNOSIS — L501 Idiopathic urticaria: Principal | ICD-10-CM

## 2023-05-18 DIAGNOSIS — J4530 Mild persistent asthma, uncomplicated: Principal | ICD-10-CM

## 2023-05-18 DIAGNOSIS — L309 Dermatitis, unspecified: Principal | ICD-10-CM

## 2023-05-18 MED ORDER — TRIAMCINOLONE ACETONIDE 0.1 % TOPICAL CREAM
Freq: Two times a day (BID) | TOPICAL | 2 refills | 0.00 days | Status: CP
Start: 2023-05-18 — End: 2024-05-17

## 2023-05-18 MED ORDER — INHALATIONAL SPACING DEVICE
0 refills | 0.00 days | Status: CP
Start: 2023-05-18 — End: ?

## 2023-05-18 MED ORDER — ALBUTEROL SULFATE HFA 90 MCG/ACTUATION AEROSOL INHALER
Freq: Four times a day (QID) | RESPIRATORY_TRACT | 5 refills | 0.00 days | Status: CP | PRN
Start: 2023-05-18 — End: 2024-05-17

## 2023-05-18 MED ORDER — CROMOLYN 100 MG/5 ML ORAL CONCENTRATE
Freq: Four times a day (QID) | ORAL | 11 refills | 30.00 days | Status: CP
Start: 2023-05-18 — End: 2024-05-17

## 2023-05-18 NOTE — Unmapped (Addendum)
 [facility]  [address]  5[address] Floor, [address]  [place], [place] [address]        Your provider today was: Claudine Mouton (Attending)    It was a pleasure seeing you again today! Here is your plan:    Let's try starting a new medicine called Gastrocrom (brand name; cromolyn is generic name). This is a mast cell stabilizer for your gut. You will start by taking 1 dose (10 milliliters, mL) by mouth twice daily, ideally ~30 minutes before a meal or at bedtime. You can go up to taking it 4 times daily (~30 minutes before meals and at bedtime). This might prevent some of those episodes of flushing that you are having.    You might have a condition called rosacea. This can also cause episodes of flushing. Talk to your primary doctor about this, because you have some redness and telangiectasias (dilated blood vessels) on your cheeks, which could indicate that you have this condition.    For the rash on your arms and underarms - this looks like a dermatitis (or general inflammation of your skin). This is sometimes called sensitive skin. It helps me to know that hydrocortisone cream is helping a little. I prescribed you a stronger steroid cream to use under your arms and on your arms to help this calm down. Keep using hydrocortisone on the sensitive skin areas (face, neck, groin). Use these steroids two times daily until the rash goes away! Keep using creams without dyes or fragrances to keep your skin moisturized (see options below).    Thank you for letting us be involved with your medical care!    Contact Information:      Appointments and Referrals   [phone]     Emergencies, refills, medical questions    [phone]) [phone]  Ask for the Allergy/Immunology Doctor on call     You can also use [email or web address] ([email or web address] to request refills, access test results, and send questions to your doctor! However, you should expect to get a response within 2-3 days after sending your message (and not immediately). If it is an urgent issue, please contact the on call physician (see above).       This handout was modified from the [facility] website, accessed on 02/18/2021: [email or web address]    Chronic Hives (Chronic Idiopathic Urticaria, CIU) with or without Angioedema  Chronic hives (chronic urticaria) are red, itchy skin welts that last more than six weeks. Many people have these welts every day for a year or longer. People with certain autoimmune diseases are more prone to chronic hives. But often, the cause of chronic hives is unknown. Antihistamines, steroids and immunosuppressants can soothe the hives. Chronic hives can also be associated with a kind of swelling called angioedema. Hives and swelling don't always have to happen together, but if they do they are probably caused by the same process (and are treated the same way). This handout will focus on hives, but keep in mind that this also applies to hives WITH angioedema, too.      What are chronic hives (chronic idiopathic urticaria)?  Chronic hives are itchy, raised, red bumps or welts that appear on your skin at least twice per week. These welts, also called wheals, are chronic when they last more than six weeks.    You may hear this condition called chronic idiopathic urticaria:  Chronic: A symptom or condition that persists even with treatment.  Idiopathic: The symptom or condition occurs  for no known reason. It comes on suddenly.  Urticaria: The medical term for hives, pronounced ur-tik-CARE-ee-uh.    What are hives (urticaria) and angioedema (swelling)?  Hives (urticaria) and angioedema (swelling) can occur together. Hives are raised red bumps or splotches on your skin. Angioedema (swelling) may occur in deep layers of tissue. This swelling can affect your face, lips, throat, hands, feet and genitals.    What???s the difference between hives and chronic hives?  Chronic hives are different than acute hives:  Acute hives start to fade within 24 hours (although new hives may replace them). They are gone within six weeks. A recent viral infection often causes acute hives.  Chronic hives are visible at least twice per week for more than six weeks. Some chronic hives last for months or years. The cause is often unknown.    How common are chronic hives?  Up to 5% of people develop chronic hives. The condition affects all ages and genders but is more common in women ages 19 to 93.      What causes chronic hives?  For most people with chronic hives, there???s no known cause. Other types of allergies may also bring on chronic hives.    Some people develop chronic hives when their body changes temperature rapidly due to heat, cold or physical activity. Pressure on your skin from tight clothing may also cause the condition.    What autoimmune diseases cause chronic hives?  About 1 in 5 people who develop chronic hives also have an autoimmune disease, such as:  Celiac disease.  Dermatomyositis.  Diabetes.  Lupus.  Polymyositis.  Rheumatoid arthritis.  Thyroid disease.  Vitiligo.    What other diseases cause chronic hives?  Other conditions that may cause chronic hives include:  Asthma.  Infections like H. pylori bacterial infections (a stomach infection that causes bad heartburn) and sinus infections (sinusitis).  Liver disease.  Lymphomas, including Hodgkin lymphoma and non-Hodgkin???s lymphoma.  Vasculitis.    What are chronic hive symptoms?  Hives can appear anywhere on your body and look different on each person. Hives can have different shapes and sizes. They may be as small as a pinprick or larger than a softball.    Chronic hive symptoms include:  Red, raised welts or bumps on your skin that may hurt or sting.  Blanching (the center of the hive turns white when you press it).  Itchy skin (pruritis).  Swelling (angioedema).      What type of doctor should I see for chronic hives?  Healthcare providers who diagnose and treat chronic hives include:  Allergists.  Dermatologists.  Primary care physicians.    How are chronic hives (chronic urticaria) diagnosed?  Your healthcare provider can diagnose chronic hives by examining your skin and learning more about your symptoms.    Diagnostic tests for chronic hives can pinpoint or rule out causes. You may get one or more of these tests:  Blood test to check for high levels of antibodies, proteins that help your body fight off bacteria, allergens and other substances.  Urine test to look for bacterial infections.  Skin biopsy procedure to confirm the diagnosis and evaluate for other causes.    You may have noticed that allergy testing is NOT included in the list of tests used to diagnose chronic hives. Chronic hives is virtually never caused by an allergy (sensitization and a hypersensitivity reaction to environmental allergens, such as pollen, molds, dust mites or animal danders). Allergy testing and lab tests  normally used to diagnose allergies will therefore not be helpful.      What are chronic hives treatments?  Treatments for chronic hives include:  Allergy medications: Daily over-the-counter (OTC) or prescription allergy medications like antihistamines relieve itching and reduce or prevent allergic reactions.  Allergy shots: A monthly injection of a drug called omalizumab (Xolair??) blocks your body???s production of immunoglobin E (IgE). People with severe allergies can make too much IgE, leading to problems like chronic hives and asthma.  Steroids: Corticosteroids like prednisone (Deltasone??, Rayos??) can ease symptoms that don???t respond to allergy medicines.  Hydroxychloroquine: A study found that 8 in 10 people with autoimmune disease-induced chronic hives got symptom relief after taking hydroxychloroquine (Plaquenil??), an antimalarial drug, for three or more months.  Cyclosporine: This immunosuppressant is highly effective at clearing up severe chronic hives. But it can cause serious side effects when taken for too long.    What are at-home treatments for chronic hives?  You can try these steps at home to ease itchy skin and soothe inflammation:  Apply an OTC anti-itch cream.  Place cool compresses on the hives several times a day (unless cool temperatures make hives worse).  Take a cool bath or shower.  Use hypoallergenic lotions and creams to moisturize dry skin.  Wear loose-fitting clothes made with soft fabric that won???t irritate your skin.      Can chronic hives last for years?  For about half of people with chronic hives, the hives go away (often without treatment) within a year. Treatments can ease symptoms of long-lasting hives.      When should I call the doctor?  Call your healthcare provider if you experience:  Hives or swelling that lasts more than a week.  Infected-looking bumps (red, swollen or pus-filled).  Recurring hives that come back every few months.  Severely itchy skin.    What should I ask my provider?  You may want to ask your healthcare provider:  What is causing the chronic hives?  When should the chronic hives go away?  Should I get an allergy test?  What???s the best treatment for chronic hives?  What???s the best treatment to reduce itching?  Should I look out for signs of complications?      Can stress or anxiety cause chronic hives?  Stress and anxiety can worsen skin diseases. Anti-anxiety medicines may help. You may also benefit from stress-relieving therapies like mindfulness or meditation.    A note from [facility]  Chronic hives (chronic urticaria) can be itchy and uncomfortable. You may become self-conscious about your appearance. Most of the time, providers can???t pinpoint the cause of chronic hives. However, treatments like antihistamines, steroids and even immunosuppressants can help. You can also take steps at home to ease itching and swelling. For many people, chronic hives eventually go away, although it may take a year or longer.             Mast Cell Activation Syndrome (MCAS)    The following information was modified from an American Academy of Allergy, Asthma, and Immunology (AAAAI) website: http://morrow-smith.net/.     Mast cells are allergy cells responsible for immediate allergic reactions. They cause allergic symptoms by releasing chemicals called ???mediators??? stored inside them (made before) or made at the time of an allergic reaction (takes a little longer for these mediators to be made and cause allergies). In allergic reactions, mediators are released when the allergy antibody IgE, which is present on the mast cell surfaces, binds  to proteins that cause allergies (called allergens). This triggering is called activation, and the release of these mediators is called degranulation. Some of these mediators are stored in granules in the mast cells and are released quickly and others are made slowly only after the cell has been triggered.           Degranulation is shown below:      Mast cells can be activated by other things, too, such as medicines, infections, and insect or reptile venoms. These responses, while they make you feel icky, are normal and help protect you from poisons and parasites and other germs. This specific process is called ???secondary activation??? because the mast cells are activated by something other than the IgE allergy antibody.    Sometimes mast cells don't work correctly and release mediators because of incorrect signals they give themselves. Certain mutations in mast cells can produce a bunch of identical mast cells (like twins!) - called clones - that make too much of the mediators and release them for no good reason. When this happens, it is called ???primary activation???. These abnormal mast cells can grow and grow and grow and are (for some reason) very sensitive to activation. This is a condition called mastocytosis.    Idiopathic Mast Cell Activation Syndrome (MCAS)    MCAS is a condition in which the patient has allergic reactions over and over again, but without eating a food they are allergic to, or without taking a medicine that can cause secondary activation of mast cells. The allergic reactions have symptoms such as hives (welps, whelps, welts), swelling, low blood pressure, trouble breathing, belly pain, bad diarrhea, feeling like you are going to throw up (nausea) or actually throwing up (vomiting), racing heart, or feeling lightheaded (or passing out, falling out). When this happens, lots and lots of mast cell mediators are released. Such a severe reaction is called anaphylaxis. Thankfully, these allergic reactions get better when you are treated with medicines that block mast cell mediators. For instance, one mediator is histamine, and the medicines we use to help stop an allergic reaction, like Benadryl and Zyrtec, are called anti-histamines because they block histamine from working.     We call this condition ???idiopathic??? because we don't know why it happens - that is, the allergic reactions are not caused by allergic antibody or other things that activate normal mast cells.    Evaluation for MCAS    Diagnostic Criteria: Need all 3 criteria for a diagnosis of MCAS.    The first step is to listen to you talk about your symptoms when you have a reaction. Your symptoms should follow some basic rules. They should:  Happen in separate attacks, with time in between them when you feel well  Be symptoms that we expect in anaphylaxis  Not have another clear cause or reason for happening    You must get better after using medicines that block mast cell mediators (like Benadryl or EpiPen).    Measured mast cell mediators in your blood and/or urine (pee) will be higher than normal, because these mediators are released and increase a lot during an allergic reaction. We measure these mediators both when you are having anaphylaxis and when you are not having anaphylaxis. This way we can tell if the levels of mediators go up during a reaction. If the levels are high even without a reaction or go up during a reaction, you may have MCAS.    Symptoms    Anaphylaxis is  a specific type of severe allergic reaction that must involve symptoms from at least 2 different organs (e.g., heart and skin, lungs and skin, heart and gut). Symptoms that can occur during anaphylaxis are:    Heart-related symptoms:   Fast heart beat (tachycardia)  Low blood pressure (hypotension)  Feeling light-headed or passing out (pre-syncope or syncope)    Skin-related symptoms:   Itching (pruritus)  Hives (urticaria or welts)  Swelling (angioedema)  Skin that turns red and hot (flushing)    Lung-related symptoms:   Cough  Wheezing  Trouble breathing  Being short of breath  Noisy breathing (a harsh noise when breathing, stridor) that occurs with throat swelling    Gastrointestinal tract (gut) symptoms:   Nausea +/- vomiting  Crampy belly pain  Diarrhea    Mediators and Tests for MCAS    Mast cells are known to make a lot of mediators (a mediator being a chemical that acts on another part of the body to cause typical allergic symptoms), but only of them or their breakdown products (metabolites) are reliably high in these allergic episodes and can be measured in laboratory tests. The only useful tests for diagnosing MCAS are serum (blood) mast cell tryptase and in urine (pee) N-methylhistamine, 2,3 dinor-11-beta-Prostaglandin F2-alpha (11B-PGF2?) and/or Leukotriene E4 (LTE4).    Blood for the serum mast cell tryptase test needs to be collected between 30 minutes and 2 hours after the start of an allergic reaction. Blood for the baseline (NOT during an episode) mast cell tryptase test is collected many days later.     Urine (pee) for the N-methylhistamine, 11B-PGF2?, and LTE4 tests are collected for 24 hours, starting immediately after an episode starts.    These are special laboratory tests, with special rules for the samples to make sure the test is run correctly. Patients should work with their Environmental education officer, who can communicate with emergency and lab personnel, to assure the tests are ordered and collected appropriately.    Treatment    Treatment helps you feel better, but it is also needed to diagnose MCAS! If you don't feel better with treatments, you probably do not have MCAS.    The treatment of allergic reaction episodes when they are happening is the same as anyone who is having an allergic reaction / anaphylaxis. Treatments may include epinephrine (EpiPen, Auvi-Q, or other epinephrine auto-injector), anti-histamines (Benadryl / diphenhydramine, Zyrtec / cetirizine, Pepcid / famotidine), steroids (prednisone, Orapred / prednisolone, dexamethasone, methylprednisolone), IV fluids, breathing treatments (albuterol, inhaled racemic epinephrine, albuterol with Atrovent / ipratropium), or help breathing with machines (non-invasive ventilation: BiPAP or CPAP machines; or invasive ventilation: breathing tube or breathing machine, also called intubation).    Anti-histamines, such as Benadryl / diphenhydramine and Atarax / Vistaril / hydroxyzine, can help with itching, stomach pain, and flushing, but they can cause side effects (sleepiness, dry mouth). Other anti-histamines, including Claritin / loratadine, Zyrtec / cetirizine, Allegra / fexofenadine, Xyzal / levocetirizine, and Clarinex / desloratadine, have fewer side effects but might not help with itching as much.    Another kind of anti-histamine is famotidine / Pepcid. This can help belly pain and nausea.    Aspirin stops your body from making a certain mediator called prostaglandin D2 and can decrease flushing.    Other medicines also block other mediators. Singulair / montelukast and Accolate / zafirlukast stop leukotriene C4 (LTC4) from working. Zyflo / zileuton stops LTC4 from being made. These medicines help with wheezing and stomach cramping.  Steroids (also called corticosteroids or glucocorticoids) are helpful for swelling, hives, and wheezing, but they do not work quickly. Thus, it should not be one of the first medicines you use to treat your symptoms.    Xolair / omalizumab stops IgE allergic antibody from sticking to the outside of mast cells. Because of this, Xolair lowers mast cell sensitivity to activation, which can stop anaphylaxis from happening before it starts.    Summary    Since anaphylaxis can look a lot like other conditions not caused by mast cells, we use 3 diagnostic criteria that you must have to qualify for a diagnosis of MCAS. If you are diagnosed with MCAS, you may need extra tests to look for certain kinds of MCAS (like when you have clones of one mast cell that are growing out of control). These tests may include a simple blood test or a bone marrow biopsy. If these tests are all negative, then you are said to have idiopathic mast cell activation syndrome.    For more information:      http://[email or web address].net/       [email or web address]      [email or web address]          Mast cell activation disorder      When mast cells are stimulated, they release sacks of chemicals that include the chemical histamine into your skin and other parts of your body. These chemicals can cause itchy skin and the throat closing / difficulty breathing sensation when they are dumped into your skin, lung, and airways. They cause you to feel dizzy and lightheaded and sometimes pass out if they are released into the blood stream or near blood vessels.      Mast Cell Activation Syndrome (MCAS) Management Plan for  Vanessa Hale  Printed: 05/18/2023          Avoid Known Triggers:   Foods:    N/A  Environmental Factors:    Strong scents, aeroallergens, cigarette smoke  Medications:   Opiates, non-steroidal anti-inflammatory drugs (NSAID)  Other:   Alcohol, vigorous exercise    Make your next allergy appointment with Dr. Claudine Mouton in : 6 months.    The phone number is [phone] or you can request an appointment in My Chart.    GREEN ZONE   DOING WELL. No symptoms at all or mild symptoms. Able to do usual activities.    Take these Daily Maintenance medications  Antihistamines  Fexofenadine (Allegra)  Hydroxyzine (Atarax, Vistaril)  Famotidine (Pepcid)  Mast Cell Stabilizers  Gastrocrom (cromolyn) 10 milliliters (mL) 4 times daily before meals and at bedtime (can start with twice daily)  Leukotriene receptor / pathway antagonists  Supplements (no data)  Immunomodulators (no data)   - took previously but not taking currentlyChemotherapeutics (no data)  Biologics (no data)  Omalizumab (Xolair) - 300 mg injection every 2 weeks    Please take all other daily medications as prescribed to help control other medical conditions you may have (which can trigger MCAS flares or be mistaken for MCAS symptoms):     Asthma - Breo Ellipta inhaler (1 puff daily), albuterol rescue inhaler when needed    Environmental allergies - Nasacort nasal spray, nasal / sinus rinses    POTS - As prescribed by your POTS doctors/health care providers.    YELLOW ZONE   MCAS symptoms are GETTING WORSE. You have all or any of the following symptoms, but you can still do some activities.    MOUTH/FACE: itching, swelling of lips and/or tongue:  SKIN: itching, hives, redness, swelling  GUT: vomiting, diarrhea, cramps    1st Step - Take Quick Relief medicine below.  If known, and if possible, stop or remove the item that triggered the flare.     Quick-Relief Antihistamines  Hydroxyzine (Vistaril, Atarax) - take up to 50 mg every 4 hours as needed. No more than 400 mg in 24 hours.  Famotidine (Pepcid) - 20 mg dose. Take up to 4 times in one day as needed.    If symptoms improve: take Quick-Relief medicines for 1 extra day (24 extra hours) and then stop and just take your GREEN ZONE medications.    If symptoms are not better within 72 hours (3 days) on these medicines:     1) During business hours, call Allergy/Immunology Office at [phone]. During nonbusiness hours, call [facility] operator at [phone] and ask for the Allergy / Immunology Doctor on Call.  Continue to take GREEN ZONE medications.       Once symptoms get better, you can stop your YELLOW ZONE quick-relief medications. Continue to take GREEN ZONE medications:       RED ZONE   MCAS flare is VERY BAD. You are having ANAPHYLAXIS.  This means--    You have 1 or more of the following:  THROAT: itching, tightness/closure, very hoarse  LUNGS: Short of breath, can't stop coughing, wheezing, trouble talking or walking.  HEART/BLOOD PRESSURE: weak pulse, dizziness, passing out    AND you have 1 or more YELLOW ZONE symptoms:  MOUTH/FACE: itching, swelling of lips and/or tongue:   SKIN: itching, hives, redness, swelling  GUT: vomiting, diarrhea, cramps    1) Inject epinephrine in thigh.  EpiPen / Generic Epipen Autoinjector  If no improvement after 5 minutes, give an additional dose of epinephrine  If medication is not working (no improvement within 5 minutes after the last dose of epinephrine), Call 911 / Go to the Emergency Department.     2) If medication is working and you get relief, call [facility] [phone] and ask for the Allergy / Immunology Doctor on Call to update them and get guidance on any additional YELLOW ZONE medications to take and whether you need to go to Emergency Department.         Dermatitis   Modified from: Skin Allergy Symptoms, Diagnosis, Treatment & Management - AAAAI https://www.rivera.org/    Irritated skin can be caused by a variety of factors. These include immune system disorders, medications and infections. When an allergen is responsible for triggering an immune system response in the skin, then it is an allergic skin condition.     When an allergist sees someone with atopic dermatitis (eczema), they also must think about a skin condition that looks very similar (and can occur together) - allergic contact dermatitis.    Atopic Dermatitis (Eczema)   Atopic dermatitis (eczema) is a chronic or recurrent inflammatory skin disease. Atopic means that there is typically a genetic tendency toward allergic disease. Atopic dermatitis usually begins in the first few years of life and is often the initial indication that a child may later develop asthma and/or allergic rhinitis (hay fever).    In infants, eczema usually appears as tiny bumps on the cheeks. Older children and adults often experience rashes on the knees or elbows (often in the folds of the joints), on the backs of hands or on the scalp.        Itching, redness and swelling are common to most skin allergies. Yet there are some  differences that help in the diagnosis of specific conditions.    Contact Dermatitis (Allergic or Irritant)  Contact dermatitis happens when your skin is directly touched by an allergen. For instance, if you have a nickel allergy and your skin comes in contact with jewelry made with even a very small amount of nickel, you may develop red, bumpy, scaly, itchy or swollen skin at the point of contact. Touching poison ivy, poison oak, or poison sumac can also cause allergic contact dermatitis. The red, itchy rash is caused by an oily coating covering these plants. The allergic reaction can come from actually touching the plant, or by touching clothing, pets or even gardening tools that have touched this oil.         Contact dermatitis may be allergic or irritant types. Irritant contact dermatitis (ICD) is more common (80%) and can happen in anyone, especially after coming into contact with something over and over again. Symptoms are a burning or stinging sensation with redness, swelling or peeling. Soaps, detergents, acids, bases, solvents, saliva, urine and stool are the most common triggers for ICD. Allergic contact dermatitis (ACD), on the other hand, is usually passed on from your mom or dad, and people tend to be very sensitive to whatever causes this skin allergy. Cosmetics, medicines, clothes dyes, as well as foods, rubber and poison ivy are common causes of ACD. Any topical cream or ointment may contain chemicals that irritate the skin. It is important to bring your personal products with you when you see your doctor so they can be examined as a potential cause of the dermatitis.     IMPORTANT!! The [facility] does not currently evaluate or do allergy testing for contact dermatitis. This allergy testing is very specialized. If we think that you may have a contact dermatitis, we will refer you to a Dermatologist.      How do you tell the difference between atopic dermatitis and allergic contact dermatitis?  There are a few clues that make allergic contact dermatitis more likely in a patient with a red, dry, itchy rash. If the rash occurs only in specific places, like around your neck, on your hands, or on your face, this could mean that you have a contact dermatitis with jewelry, metals, hand soaps, lotions, or makeup products. If you work in a place with certain chemicals, like in a hair salon or healthcare setting, you are more likely to have a contact dermatitis. If your rash doesn't get better with treatment, ask your Dermatologist or Allergist if you could have a contact dermatitis.      Eczema Care Plan  The rest of this handout will focus on treatment for atopic dermatitis, although treatments and recommendations for allergic contact dermatitis are very similar in general.    Eczema is the most common skin condition, especially in children. It affects one in five infants but only 10% of adults. One explanation for eczema is thought to be due to ???leakiness??? of the skin barrier, which causes it to dry out and become prone to irritation and inflammation by many environmental factors. Also, some young children with eczema can flare with a particular food. In about half of patients with severe atopic dermatitis, the disease is due to inheritance of a faulty gene in their skin called filaggrin, although this is affected by your race and ethnicity. Unlike with urticaria (hives), histamine is not the only cause of the itch of eczema so anti-histamines may not control the symptoms.  Eczema is often linked with asthma, allergic rhinitis (hay fever), and/or food allergy. When these conditions develop in this order, it is called the atopic march.     Symptoms  Symptoms of atopic dermatitis (eczema) include:   Patches of skin that can be red or darker/lighter than your normal skin color  Itchy skin, especially at night  Dry, cracked, or scaly skin  A patch of thick skin (thicker than the skin around the patch)   May ???weep??? or leak fluid, that dries to become a crust - this usually means the rash could be infected by a bacteria    In infants, eczema often appears on the face. Children are prone to have the rash at the bends of the elbow joint, wrists, behind the knees, and behind the ears. Adolescents and young adults typically have the rash in the same locations as children, as well as on the hands and feet.    Diagnosis  In many people, the exact cause of the eczema is not clear. It usually comes from mom or dad; this kind of condition is called hereditary, which means that someone inherits the condition from one or both parents.     Infants and young children with more severe eczema should be evaluated for food allergy. It???s important to see an allergist / immunologist for diagnosis and management. Often input from a dietitian is needed as well.    Food allergies causing eczema are much less common in older children and adults. If you are suspected of having eczema that is caused by a food allergy, a confirmed diagnosis requires avoiding the trigger food for about four weeks with the help of a dietitian before doing a food challenge under your doctor???s supervision to confirm that the food was actually causing the flare.     Treatment  Eczema is sometimes described as an ???itch that rashes.??? This means that even without a rash, people with eczema tend to be very itchy. Then, when the patient scratches their skin, a rash will appear. The rash is still itchy, and so the patient keeps scratching it! This is called the Itch-Scratch Cycle and it is a big reason why eczema can be so tough to treat. Breaking the Itch-Scratch Cycle is important in the treatment of eczema.    I generally treat eczema in 2 ways - BOTH are necessary to control eczema:  Maintain or rebuild the skin barrier, and  Control inflammation in the skin.    Let's look at these two methods in more detail.    A person with eczema has skin that makes a poor barrier from the outside world, letting irritants and allergens into their skin. This activates the immune system, because it thinks it is being attacked and that you are in danger! A poor skin barrier also lets water evaporate, leaving skin dry. Therefore, making a good barrier for your skin is a good first step to treating your eczema.    How do we repair our skin barrier? Great question! The key is to apply moisturizers to the skin multiple times through the day. This hydrates the skin and makes the barrier stronger.    Ointments (plain petroleum jelly/Vaseline, Aquaphor, coconut oil) are thick and greasy and GREAT for repairing the skin! They work really well overnight.          Heavy Creams (Vanicream, CereVe, Cetaphil, Eucerin) are another good way to repair your skin. These are thick but not as greasy as ointments. *AVOID LOTIONS*,  which are thin and have more alcohol in them - which helps them spread more easily on your skin, but it doesn't moisturize your skin as well as creams or ointments.        In children where the skin is oozing, crusting and painful, an infection that needs treatment with antibiotics may be the primary trigger.    Topical medications to reduce inflammation include topical steroids, calcineurin inhibitors, phosphodiesterase 4 inhibitors and topical JAK inhibitors. The itch is not relieved by antihistamines, although these are sometimes used at night to help people with eczema sleep.    There are several newer therapies approved for treatment of atopic dermatitis that is otherwise difficult to control. Dupilumab, a monoclonal antibody blocking interleukin-4 and interleukin-13, is an injectable biologic therapy that is used to treat adults and children 82 months of age and older with moderate-to-severe atopic dermatitis. Tralokinumab, a monoclonal antibody blocking interleukin-13, is another injectable biologic recently approved to treat adults age 37 and older with moderate- to-severe atopic dermatitis. Two oral JAK/STAT inhibitors have recently been approved for the treatment of moderate to severe atopic dermatitis. Upadacitinib is approved for age 52 and older and abrocitinib is approved for age 23 and older.    Antibiotics may be prescribed if a skin bacterial infection is suspected as a trigger for your eczema flare-up. Symptoms include crusting, oozing and pain. If a secondary fungal infection is suspected, it should be treated with antifungals. Oral steroids should be avoided, as although they are effective the eczema usually returns when the medicine is stopped. Oral steroids can also cause serious side-effects if taken for long periods of time.    Sometimes cotton undergarments and body suits help protect the skin from irritants and from scratching. Avoid using soap products that contain sodium laurel sulfate and any triggers that cause a reaction. Your allergist will be able to help determine whether there is a trigger that can be avoided.    These skin allergy treatment and management strategies can relieve social challenges as well. People with eczema, especially children, are sometimes ignored or singled out by others who believe the rash is contagious.         RECOMMENDATIONS:    Avoid aggravating factors (things that can make eczema worse). Try to avoid using soaps, detergents or lotions with perfumes or other fragrances. Other possible aggravating factors include heat, sweating, dry environments, synthetic fibers and tobacco smoke.    Bathing: Take a bath or shower once daily to keep the skin hydrated (moist). Baths should not be longer than 10 to 15 minutes; the water should not be too warm and use fragrance free soap (such as Dove).    Moisturizing ointments/creams (emollients): Apply emollients to entire body as often as possible, but at least once daily. If you are also using topical steroids, then emollients should be used after applying topical steroids. The best emollients are thick creams (such as Eucerin, Cetaphil, Vanicream, CeraVe) or ointments (such as petroleum jelly, Aquaphor, and Vaseline).    Topical steroids:  Topical steroids can be very effective for the treatment of eczema. It is important to use topical steroids as directed by your healthcare provider to reduce the likelihood of any side effects.  For affected areas on the face, neck or groin: Apply hydrocortisone 1% cream twice daily until the skin feels ???smooth???. Then use once or twice daily as needed for flares.  For affected areas on the trunk or extremities: Apply triamcinolone 0.1% cream twice daily until  the skin feels ???smooth???. Then use once or twice daily as needed for flares.    Please let your healthcare provider know if there is no improvement after 14 days of treatment.      Itching: For itching despite the above treatments, you may give cetirizine (Zyrtec) 10 mg at bedtime.    Hands: Hands can be very difficult to treat as oftentimes jobs and daily activities require frequent hand washing. Prior to going to bed, apply a thick/greasy cream such as Vaseline or Crisco food shortening to your hands and cover them with cotton gloves overnight.    For more information, please visit the following websites:    UpToDate  RetroCab.uy    MedLine Plus  https://www.avila.info/    National Eczema Association  www.nationaleczema.org

## 2023-05-23 ENCOUNTER — Ambulatory Visit: Admit: 2023-05-23 | Discharge: 2023-05-24 | Payer: BLUE CROSS/BLUE SHIELD | Attending: Family | Primary: Family

## 2023-05-23 DIAGNOSIS — R635 Abnormal weight gain: Principal | ICD-10-CM

## 2023-05-23 DIAGNOSIS — I479 Paroxysmal tachycardia, unspecified: Principal | ICD-10-CM

## 2023-05-23 DIAGNOSIS — F900 Attention-deficit hyperactivity disorder, predominantly inattentive type: Principal | ICD-10-CM

## 2023-05-23 DIAGNOSIS — T753XXA Motion sickness, initial encounter: Principal | ICD-10-CM

## 2023-05-23 DIAGNOSIS — M25561 Pain in right knee: Principal | ICD-10-CM

## 2023-05-23 DIAGNOSIS — D4709 Other mast cell neoplasms of uncertain behavior: Principal | ICD-10-CM

## 2023-05-23 DIAGNOSIS — G8929 Other chronic pain: Principal | ICD-10-CM

## 2023-05-23 DIAGNOSIS — R21 Rash and other nonspecific skin eruption: Principal | ICD-10-CM

## 2023-05-23 MED ORDER — SCOPOLAMINE 1 MG OVER 3 DAYS TRANSDERMAL PATCH
MEDICATED_PATCH | TRANSDERMAL | 1 refills | 12.00 days | Status: CP
Start: 2023-05-23 — End: 2024-05-22

## 2023-05-23 NOTE — Unmapped (Signed)
 [facility]     Assessment/Plan       Encounter Diagnoses   Name Primary?    Attention deficit hyperactivity disorder (ADHD), predominantly inattentive type Yes    Weight gain     Mast cell disease     Rash     Motion sickness, initial encounter     Paroxysmal tachycardia, unspecified (CMS-HCC)      Assessment & Plan  Attention deficit hyperactivity disorder (ADHD), predominantly inattentive type  Managed with Adderall, covered by insurance. Previous issues with Vyvanse availability and coverage.  - Continue Adderall as prescribed.    Weight management  Weight loss difficult due to knee pain. Exploring medications with concern for mast cell interactions. Options include metformin, topiramate, Wellbutrin. Consultation with mast cell specialist planned.  - Discuss weight loss medication options with mast cell specialist.  - Consider GLP-1, metformin, topiramate, or Wellbutrin if appropriate.    Mast cell activation syndrome/rash  Follows with allergist.  Has had new rashes not felt related to her mast cell syndrome but lesion was not biopsied.  She does have erythematous maculopapular lesions of her bilateral axilla.  Managed with omalizumab and cromolyn was recently added.   Recent rash flare-up, possibly eczema or mast cell-related. . Dermatology referral planned for rash evaluation.  - Continue omalizumab as prescribed.  - Start cromolyn sodium as directed.  - Use prescription cream for rash as needed.  - Refer to dermatology in [place] for further evaluation of rash.    Motion sickness  Significant motion sickness, especially on cruises. Planning [place] cruise, seeking preventive measures.  - Prescribe scopolamine patches for motion sickness.    Right knee osteoarthritis  Severe osteoarthritis limits exercise, complicates weight loss. Osteotomy advised by orthopedics, currently using knee brace. Degenerative condition possibly worsened by gait.  - Continue using knee brace as needed.  - Discuss potential weight loss strategies with mast cell specialist.      Patient response, barriers, and adherence to medication have been identified and addressed. Barriers to the treatment plan have been identified and addressed with patient. Unhealthy behaviors have been noted and addressed as appropriate. Patient voiced understanding and all questions have been answered to satisfaction. Return in about 3 months (around 08/23/2023) for adhd.    ORDERS THIS VISIT     Orders Placed This Encounter   Procedures    Ambulatory referral to Dermatology      New Prescriptions    SCOPOLAMINE (TRANSDERM-SCOP) 1 MG OVER 3 DAYS    Place 1 patch (1 mg total) on the skin every third day.      Modified Medications    No medications on file      Discontinued Medications    No medications on file        Subjective:   Time seen  May 23, 2023 8:05 AM    ZOX:WRUEA, Theophilus Bones, FNP    Eileen Stanford Gloriajean Okun, is a 38 y.o. female    HPI:  History of Present Illness  Vanessa Hale is a 38 year old female presenting for routine 18-month follow-up.    She has a new rash, particularly severe under her armpits, causing pain when shaving. She has been using a prescription cream prescribed by mast cell specialist, which has helped to dry the rash somewhat, but it remains present. She has not yet started the prescribed cromolyn sodium,  Her symptoms flare unpredictably and are difficult to calm down.    She is experiencing difficulty  with weight loss, attributing some of the challenge to her inability to exercise due to severe osteoarthritis in her knee. She is using a brace and managing her weight by monitoring her diet, but she remains at the same weight. .    She is planning a cruise to [place] in May and is concerned about motion sickness, having experienced severe sickness on a previous cruise. She requests a prescription for scopolamine patches to manage this.    She is currently taking Adderall for ADHD, which is covered by her insurance, but has had issues with obtaining Vyvanse due to insurance and availability problems.    (Verbal consent obtained prior to using the Ambient solution)    The following portions of the patient's history were reviewed and updated as appropriate: allergies, current medications, past family history, past medical history, past social history, past surgical history and problem list.    ROS:   A 12 point review of systems was conducted with negative results except as noted in the HPI.     Objective:     Physical Exam  BP 136/86  - Pulse 88  - Wt 84.8 kg (187 lb)  - BMI 34.20 kg/m??     General-  Normal appearing overweight female in no apparent distress.  Neurologic- Alert and oriented X3.  Cranial nerve II-XII grossly intact. No focal abnormality.  HEENT-  Normocephalic atraumatic head.  No scleral icterus. .  Neck- Supple, JVD normal., No carotid bruits No lymphadenopathy  Respiratory- respirations even and unlabored,  Lungs clear to auscultation a/p, no wheezes, rhonchi, or rales.  Cardiovascular  heart rhythm regular. Murmur not present.  Gastrointestinal- Soft, .  MSK-  No deformity, No clubbing or cyanosis. No edema, pulses intact   Psych- Normal mood, appropriate.  Integumentary -erythematous maculopapular lesions noted bilateral axilla otherwise skin warm, dry, and intact  Current Outpatient Medications   Medication Sig Dispense Refill    albuterol HFA 90 mcg/actuation inhaler Inhale 2-4 puffs every six (6) hours as needed for wheezing. 18 g 5    cromolyn (GASTROCROM) 100 mg/5 mL solution Take 10 mL (200 mg total) by mouth Four (4) times a day (before meals and nightly). 1200 mL 11    dextroamphetamine-amphetamine (ADDERALL) 20 mg tablet Take 1.5 tablets (30 mg total) by mouth daily. 45 tablet 0    EPINEPHrine (EPIPEN) 0.3 mg/0.3 mL injection Inject 0.3 mL (0.3 mg total) into the muscle every five (5) minutes as needed for anaphylaxis. 2 each 3    ibuprofen (MOTRIN) 800 MG tablet Take 1 tablet (800 mg total) by mouth Three (3) times a day.      inhalational spacing device Spcr 1 each by Miscellaneous route Take as directed. 1 each 0    omalizumab (XOLAIR) 150 mg/mL syringe Inject the contents of 2 syringes (300 mg total) under the skin every fourteen (14) days. 4 mL 12    triamcinolone (KENALOG) 0.1 % cream Apply topically two (2) times a day. 453.6 g 2    scopolamine (TRANSDERM-SCOP) 1 mg over 3 days Place 1 patch (1 mg total) on the skin every third day. 4 patch 1     No current facility-administered medications for this visit.       Past Medical History:   Diagnosis Date    ADD (attention deficit disorder) without hyperactivity     GERD (gastroesophageal reflux disease)     Mast cell disease        Past Surgical History:   Procedure  Laterality Date    ADENOIDECTOMY      CESAREAN SECTION, CLASSIC      HYSTERECTOMY      partial    LIVER BIOPSY      PR BIOPSY SOFT TISSUE NECK/CHEST Right 02/15/2021    Procedure: BIOPSY, SOFT TISSUE OF NECK OR THORAX;  Surgeon: Karrie Meres, MD;  Location: MAIN OR [facility];  Service: ENT    SINUS SURGERY      THYROID SURGERY      biopsy    TONSILLECTOMY         Social History     Socioeconomic History    Marital status: Married     Spouse name: None    Number of children: None    Years of education: None    Highest education level: None   Tobacco Use    Smoking status: Never    Smokeless tobacco: Never   Vaping Use    Vaping status: Never Used   Substance and Sexual Activity    Alcohol use: Not Currently    Drug use: Never     Social Drivers of Health     Transportation Needs: No Transportation Needs (09/15/2021)    PRAPARE - Transportation     Lack of Transportation (Medical): No     Lack of Transportation (Non-Medical): No         [facility]  [phone]  Telephone [phone]  Fax [phone]

## 2023-05-23 NOTE — Unmapped (Signed)
 Semaglutide or Tirzepatide (injectable)    Metformin, topiramate, or wellbutrin

## 2023-06-12 DIAGNOSIS — F988 Other specified behavioral and emotional disorders with onset usually occurring in childhood and adolescence: Principal | ICD-10-CM

## 2023-06-12 MED ORDER — DEXTROAMPHETAMINE-AMPHETAMINE 20 MG TABLET
ORAL_TABLET | Freq: Every day | ORAL | 0 refills | 30.00 days | Status: CP
Start: 2023-06-12 — End: 2024-06-11

## 2023-06-12 NOTE — Unmapped (Signed)
 Patient is requesting the following refill  Requested Prescriptions     Pending Prescriptions Disp Refills    dextroamphetamine-amphetamine (ADDERALL) 20 mg tablet 45 tablet 0     Sig: Take 1.5 tablets (30 mg total) by mouth daily.       Last refill given on: 3/10 with 45 count and 0 refills.     Recent Visits  Date Type Provider Dept   05/23/23 Office Visit Perri Brake, FNP [place] [facility] [facility]   02/21/23 Office Visit Perri Brake, [place] [facility]   11/22/22 Office Visit Perri Brake, [place] [facility]   Showing recent visits within past 365 days and meeting all other requirements  Future Appointments  Date Type Provider Dept   08/29/23 Appointment Perri Brake, FNP [place] [facility]   Showing future appointments within next 365 days and meeting all other requirements       Opioid Monitoring   Urine Tox Screen Last Drug Screen Date: Not Found  Opiate Confirmation Test Last Drug Screen Date: Not Found  Last Opioid Dispensed Provider: Toniann Franklin, MD  Prescribed MEDD : 0  Last PDMP Review: 05/23/2023  8:12 AM  Last Opioid Pain Agreement Signed Date: Not Found  Last Non Opioid Controlled Substance Pain Agreement Signed Date: Not Found    Naloxone Ordered: Not Found  Last OV: 05/23/2023

## 2023-06-12 NOTE — Unmapped (Signed)
 [facility] Refill Coordination Note    Vanessa Hale, [place]: February 10, 1986  Phone: [phone] (home)       All above HIPAA information was verified with patient.         06/07/2023     8:01 AM   Specialty Rx Medication Refill Questionnaire   Which Medications would you like refilled and shipped? Xolair 300mg    Please list all current allergies: Ceclor zpack   Have you missed any doses in the last 30 days? No   Have you had any changes to your medication(s) since your last refill? No   How many days remaining of each medication do you have at home? 0   If receiving an injectable medication, next injection date is 06/11/2023   Have you experienced any side effects in the last 30 days? No   Please enter the full address (street address, city, state, zip code) where you would like your medication(s) to be delivered to. [address], [place], [place] [address]   Please specify on which day you would like your medication(s) to arrive. Note: if you need your medication(s) within 3 days, please call the pharmacy to schedule your order at [phone]  06/11/2023   Has your insurance changed since your last refill? No   Would you like a pharmacist to call you to discuss your medication(s)? No   Do you require a signature for your package? (Note: if we are billing Medicare Part B or your order contains a controlled substance, we will require a signature) No   I have been provided my out of pocket cost for my medication and approve the pharmacy to charge the amount to my credit card on file. No   Additional Comments: I just need a call if it is a crazy amount of money after insurance         Completed refill call assessment today to schedule patient's medication shipment from the [facility] [phone]).  All relevant notes have been reviewed.       Confirmed patient received a Conservation officer, historic buildings and a Surveyor, mining with first shipment. The patient will receive a drug information handout for each medication shipped and additional FDA Medication Guides as required.         REFERRAL TO PHARMACIST     Referral to the pharmacist: Not needed      [facility]     Shipping address confirmed in Epic.     Delivery Scheduled: Yes, Expected medication delivery date: 06/12/23 .     Medication will be delivered via Same Day Courier to the prescription address in Epic WAM.    Vanessa Hale   [facility] Specialty Technician

## 2023-06-13 MED FILL — XOLAIR 150 MG/ML SUBCUTANEOUS SYRINGE: SUBCUTANEOUS | 28 days supply | Qty: 4 | Fill #4

## 2023-07-04 ENCOUNTER — Inpatient Hospital Stay: Admit: 2023-07-04 | Discharge: 2023-07-05 | Payer: BLUE CROSS/BLUE SHIELD

## 2023-07-04 ENCOUNTER — Ambulatory Visit: Admit: 2023-07-04 | Discharge: 2023-07-05 | Payer: BLUE CROSS/BLUE SHIELD

## 2023-07-04 DIAGNOSIS — M25561 Pain in right knee: Principal | ICD-10-CM

## 2023-07-04 DIAGNOSIS — G8929 Other chronic pain: Principal | ICD-10-CM

## 2023-07-04 LAB — CBC W/ AUTO DIFF
BASOPHILS ABSOLUTE COUNT: 0.1 10*9/L (ref 0.0–0.1)
BASOPHILS RELATIVE PERCENT: 0.9 %
EOSINOPHILS ABSOLUTE COUNT: 0.1 10*9/L (ref 0.0–0.5)
EOSINOPHILS RELATIVE PERCENT: 2.3 %
HEMATOCRIT: 40.8 % (ref 34.0–44.0)
HEMOGLOBIN: 14.1 g/dL (ref 11.3–14.9)
LYMPHOCYTES ABSOLUTE COUNT: 1.8 10*9/L (ref 1.1–3.6)
LYMPHOCYTES RELATIVE PERCENT: 31.1 %
MEAN CORPUSCULAR HEMOGLOBIN CONC: 34.6 g/dL (ref 32.0–36.0)
MEAN CORPUSCULAR HEMOGLOBIN: 30.7 pg (ref 25.9–32.4)
MEAN CORPUSCULAR VOLUME: 88.9 fL (ref 77.6–95.7)
MEAN PLATELET VOLUME: 8.1 fL (ref 6.8–10.7)
MONOCYTES ABSOLUTE COUNT: 0.5 10*9/L (ref 0.3–0.8)
MONOCYTES RELATIVE PERCENT: 8.8 %
NEUTROPHILS ABSOLUTE COUNT: 3.2 10*9/L (ref 1.8–7.8)
NEUTROPHILS RELATIVE PERCENT: 56.9 %
PLATELET COUNT: 257 10*9/L (ref 150–450)
RED BLOOD CELL COUNT: 4.59 10*12/L (ref 3.95–5.13)
RED CELL DISTRIBUTION WIDTH: 12.6 % (ref 12.2–15.2)
WBC ADJUSTED: 5.7 10*9/L (ref 3.6–11.2)

## 2023-07-04 LAB — C-REACTIVE PROTEIN: C-REACTIVE PROTEIN: <5 mg/L (ref <=10.0)

## 2023-07-04 LAB — RHEUMATOID FACTOR, QUANT: RHEUMATOID FACTOR: 3.9 [IU]/mL (ref <14.0)

## 2023-07-04 LAB — SEDIMENTATION RATE: ERYTHROCYTE SEDIMENTATION RATE: 7 mm/h (ref 0–20)

## 2023-07-04 NOTE — Unmapped (Signed)
 Chief complaint: Right knee pain    38 year old female who is self-referred for evaluation of right knee pain.  Apparently, the patient has had longstanding bilateral knee pain right greater than left which became more symptomatic last year.  She was seen at [facility] and was diagnosed as having a meniscus tear and underwent a right knee arthroscopy with meniscal repair in April 2024.  She states that she did well with her physical therapy afterwards and there were no complications of infection or DVT or loss of motion but her pain did not improve and she was seen by Dr. Ginette Lah for evaluation.  She was found to have degenerative changes of her knee with an overall varus alignment and possible osteotomy was recommended.  She has been treated with a medial unloader brace which she finds helpful but it is difficult to wear but does use when working.  She ambulates independently and locates the pain primarily medially and worse with weightbearing.  She has no back pain and no radicular type pain and denies any hip pain.  She has some relief with use of ibuprofen.  She has no true mechanical symptoms or symptoms of instability.  She did undergo an intra-articular cortisone injection several months ago which gave her approximately 2 months of relief.  She will occasionally use a walker or even crutches if her right knee flares up.  She would like to discuss options for her right knee.    Review of systems: Notably absent for myocardial infarction, hypertension, diabetes, seizure, cancer, bleeding problems, DVT, asthma, peptic ulcer disease.  She states that she has had a reaction to inexpensive jewelry especially earrings but has never had any type of metal skin testing.    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     Exam:  Vitals: Height-5 feet 3-1/2 inches weight-186 pounds BMI-33 heart rate-82 and regular  Gait: Patient ambulates independently with no antalgic gait and a proper heel toe gait  Right hip: Greater trochanter nontender, no pain with range of motion and rotation, negative Stinchfield test  Left hip: Greater trochanter nontender, no pain with range of motion and rotation, negative Stinchfield test  Right knee: 1-2+ effusion, range of motion 0-110 degrees, no medial or lateral joint line tenderness, no pain or instability with varus or valgus stress testing, no anterior or posterior laxity, circumduction maneuvers negative, active knee extension  Left knee: No effusion, nontender, range of motion 0-120 degrees, stable to varus and valgus stress testing, active knee extension  Neurovascular exam: Motor: 5/5 bilateral lower extremities, sensation intact to light touch bilateral lower extremities, straight leg raise test negative bilaterally, 2+ dorsalis pedis pulses bilaterally  Skin: No ulcerations or lesions bilateral lower extremities    Radiographs: Standing knee films show right knee: Femorotibial angle of approximately neutral, approximately 40 to 50% loss of the medial and lateral joint spaces with small osteophyte formation medially.  Hip-knee-ankle radiograph shows approximately 5 degrees varus    Assessment:  1.  History of paroxysmal tachycardia  2.  History of a thyroid nodule  3.  Liver hemangioma  4.  ADD  5.  Mast cell disease  6.  GERD  7.  Obesity  8.  Mild degenerative arthritis right knee    38 year old female who is employed as a part-time Buyer, retail and spends a great deal of time  on her feet who has had longstanding bilateral knee pain more symptomatic on the right-hand side.  She is now approximately 1 year status post right knee arthroscopy with meniscal repair and, although she recovered well from surgery, she has had persistent pain.  Her pain is definitely activity related and she has no true mechanical symptoms or symptoms of instability.  There is no evidence of a meniscal tear nor ligamentous laxity.  She has diffuse swelling with decreased range of motion on the right-hand side and x-rays show degenerative changes of the medial and lateral compartments although she does have overall varus alignment.  I explained that I am concerned that she may have an inflammatory type arthropathy.  There is a questionable history of rheumatoid arthritis in her family and I feel it would be prudent to obtain inflammatory markers for initial evaluation.  If these are abnormal, I would recommend referral to rheumatology.  I explained to her that I am not convinced that she would benefit from an osteotomy as she does have both medial and lateral compartment disease and evidence of more of an inflammatory type arthropathy.  The osteotomy would definitely improve her overall alignment but may not improve her symptoms.  I did review with her the importance of an exercise and weight loss program which she understands and she does find the ibuprofen helpful for flareups.  I reviewed her x-rays and answered her questions.  I spent approximately 30 to 35 minutes with her the majority in education and counseling.    Plan:  1.  Obtain blood work to assess for inflammatory arthropathy  2.  Recommended an exercise and weight loss program  3.  Over-the-counter Tylenol or ibuprofen as needed for pain  4.  Continued use of her medial unloader brace as needed  5.  Follow-up on an as needed basis.  If however, the patient has elevation of her inflammatory markers plan will be to refer to rheumatology.

## 2023-07-06 LAB — CYCLIC CITRUL PEPTIDE ANTIBODY, IGG
CCP ANTIBODIES: 1.1 {ELISA'U} (ref <7.0)
CCP IGG ANTIBODIES: NEGATIVE

## 2023-07-07 LAB — ANA: ANTINUCLEAR AB (MAYO): NEGATIVE

## 2023-07-11 DIAGNOSIS — F988 Other specified behavioral and emotional disorders with onset usually occurring in childhood and adolescence: Principal | ICD-10-CM

## 2023-07-11 MED ORDER — DEXTROAMPHETAMINE-AMPHETAMINE 20 MG TABLET
ORAL_TABLET | Freq: Every day | ORAL | 0 refills | 30.00000 days
Start: 2023-07-11 — End: 2024-07-10

## 2023-07-11 NOTE — Unmapped (Signed)
 Patient is requesting the following refill  Requested Prescriptions     Pending Prescriptions Disp Refills    dextroamphetamine-amphetamine (ADDERALL) 20 mg tablet 45 tablet 0     Sig: Take 1.5 tablets (30 mg total) by mouth daily.       Recent Visits  Date Type Provider Dept   05/23/23 Office Visit Perri Brake, FNP [place] [facility]   02/21/23 Office Visit Perri Brake, [place] [facility]   11/22/22 Office Visit Perri Brake, [place] [facility]   Showing recent visits within past 365 days and meeting all other requirements  Future Appointments  Date Type Provider Dept   08/29/23 Appointment Perri Brake, FNP [place] [facility]   Showing future appointments within next 365 days and meeting all other requirements       Labs: Not applicable this refill

## 2023-07-12 MED ORDER — DEXTROAMPHETAMINE-AMPHETAMINE 20 MG TABLET
ORAL_TABLET | Freq: Every day | ORAL | 0 refills | 30.00000 days | Status: CP
Start: 2023-07-12 — End: 2024-07-11

## 2023-07-13 NOTE — Unmapped (Signed)
 I reviewed this patient case and all documentation provided by the learner and was readily available for consultation during their interaction with the patient.  I agree with the assessment and plan listed below.    Oliva Bustard, PharmD   [facility] Specialty Pharmacist

## 2023-07-13 NOTE — Unmapped (Addendum)
 The [facility] has made a second and final attempt to reach this patient to refill the following medication:Xolair.      We have left voicemail with patient at the following phone numbers: [phone], have sent a text message to the following phone numbers: [phone], and have sent a Mychart questionnaire..    Dates contacted: 07/04/23, 07/13/23  Last scheduled delivery: 06/12/23    The patient may be at risk of non-compliance with this medication. The patient should call the [facility] at [phone]  Option 4, then Option 3: Allergy, Immunology, Pulmonary, Neurology to refill medication.    [facility]

## 2023-07-23 MED ORDER — TIRZEPATIDE (WEIGHT LOSS) 2.5 MG/0.5 ML SUBCUTANEOUS SOLUTION
SUBCUTANEOUS | 3 refills | 0.00000 days | Status: CP
Start: 2023-07-23 — End: ?

## 2023-08-13 DIAGNOSIS — F988 Other specified behavioral and emotional disorders with onset usually occurring in childhood and adolescence: Principal | ICD-10-CM

## 2023-08-13 NOTE — Unmapped (Signed)
 Patient is requesting the following refill  Requested Prescriptions     Pending Prescriptions Disp Refills    dextroamphetamine-amphetamine (ADDERALL) 20 mg tablet 45 tablet 0     Sig: Take 1.5 tablets (30 mg total) by mouth daily.       Recent Visits  Date Type Provider Dept   05/23/23 Office Visit Perri Brake, FNP [place] [facility]   02/21/23 Office Visit Perri Brake, [place] [facility]   11/22/22 Office Visit Perri Brake, [place] [facility]   Showing recent visits within past 365 days and meeting all other requirements  Future Appointments  Date Type Provider Dept   08/29/23 Appointment Perri Brake, FNP [place] [facility]   Showing future appointments within next 365 days and meeting all other requirements       Labs: Not applicable this refill

## 2023-08-20 MED ORDER — DEXTROAMPHETAMINE-AMPHETAMINE 20 MG TABLET
ORAL_TABLET | Freq: Every day | ORAL | 0 refills | 30.00000 days | Status: CP
Start: 2023-08-20 — End: 2024-08-19

## 2023-08-29 DIAGNOSIS — E663 Overweight: Principal | ICD-10-CM

## 2023-08-29 DIAGNOSIS — F900 Attention-deficit hyperactivity disorder, predominantly inattentive type: Principal | ICD-10-CM

## 2023-08-29 DIAGNOSIS — D4709 Other mast cell neoplasms of uncertain behavior: Principal | ICD-10-CM

## 2023-08-29 DIAGNOSIS — G8929 Other chronic pain: Principal | ICD-10-CM

## 2023-08-29 DIAGNOSIS — M25561 Pain in right knee: Principal | ICD-10-CM

## 2023-08-29 MED ORDER — CYANOCOBALAMIN (VIT B-12) 1,000 MCG/ML INJECTION SOLUTION
SUBCUTANEOUS | 3 refills | 90.00000 days | Status: CP
Start: 2023-08-29 — End: ?

## 2023-08-29 NOTE — Unmapped (Signed)
 [facility]     Assessment/Plan       Encounter Diagnoses   Name Primary?    Overweight Yes    Mast cell disease     Chronic pain of right knee     Attention deficit hyperactivity disorder (ADHD), predominantly inattentive type      Assessment & Plan  Weight gain  Tirzepatide  led to 10-pound weight loss and improved swelling of her extremities (she thinks it might be helping her mast cell disease symptoms). Fatigue managed by dose adjustment. Discussed B12 injections for fatigue. Paying out of pocket for tirzepatide .  - Continue tirzepatide  at 2.5 mg dose.  - Prescribe B12 injections, half a vial subcutaneously weekly.  - Monitor weight loss, consider increasing tirzepatide  if plateau.    Right knee osteoarthritis  Advised weight loss to manage symptoms and delay joint replacement. Avoid osteotomy unless symptoms worsen.  - Focus on weight loss.  - Avoid osteotomy, delay joint replacement unless symptoms worsen.    Attention deficit hyperactivity disorder (ADHD), predominantly inattentive type  Fatigue from tirzepatide  affects alertness. Discussed potential trial without Adderall to assess symptoms.  - Continue dextroamphetamine -amphetamine  30 mg oral daily.  - Consider trial without Adderall to assess ADHD symptoms.    Follow-up  - Schedule follow-up appointment in three months.    Patient response, barriers, and adherence to medication have been identified and addressed. Barriers to the treatment plan have been identified and addressed with patient. Unhealthy behaviors have been noted and addressed as appropriate. Patient voiced understanding and all questions have been answered to satisfaction. Return in about 3 months (around 11/29/2023) for adhd.    ORDERS THIS VISIT     No orders of the defined types were placed in this encounter.     New Prescriptions    CYANOCOBALAMIN , VITAMIN B-12, 1,000 MCG/ML INJECTION    Inject 1 mL (1,000 mcg total) under the skin every thirty (30) days.      Modified Medications    No medications on file      Discontinued Medications    No medications on file        Subjective:   Time seen  August 29, 2023 9:26 AM    ERE:Rojmx, Coolidge Grebe, FNP    Vanessa Hale, is a 38 y.o. female    HPI:  History of Present Illness  Vanessa Hale is a 38 year old female who presents for follow-up regarding weight management and medication side effects.    She has been using tirzepatide  for approximately one month for weight management, resulting in a weight loss of ten pounds. She experiences significant fatigue as a side effect. She administers the medication at a dose of 2.5 mg, initially taking the full dose weekly, but then splitting the dose due to fatigue.    Despite concurrent use of Adderall for ADD, she experiences fatigue, which previously helped her stay alert and focused. She has not yet tried discontinuing Adderall to evaluate the effects of tirzepatide  alone on her fatigue and ADD symptoms.    Her bowel movements are normal, and she feels that tirzepatide  may be helping with her mast cell condition, as she feels less swollen and notes a potential anti-inflammatory effect. She is also on Xolair  for her mast cell condition.    Her knees are feeling better, attributing this improvement to her weight loss.    (Verbal consent obtained prior to using the Ambient solution)    The following portions of  the patient's history were reviewed and updated as appropriate: allergies, current medications, past family history, past medical history, past social history, past surgical history and problem list.    ROS:   A 12 point review of systems was conducted with negative results except as noted in the HPI.     Objective:     Physical Exam  BP 100/70  - Pulse 83  - Temp 36.7 ??C (98.1 ??F) (Oral)  - Ht 160.7 cm (5' 3.25)  - Wt 79.8 kg (176 lb)  - SpO2 99%  - BMI 30.93 kg/m??     General-  Normal appearing overweight female in no apparent distress.  Neurologic- Alert and oriented X3. SABRA No focal abnormality.  HEENT-  Normocephalic atraumatic head.  No scleral icterus.    Neck- Supple,  Respiratory- respirations even and unlabored,   Cardiovascular  heart rhythm regular   Abdomen:  soft, nondistended,   MSK-  No deformity, No clubbing or cyanosis   Psych- Normal mood, appropriate.  Integumentary - skin warm, dry, and intact  Current Medications[1]    Past Medical History[2]    Past Surgical History[3]    Social History[4]      [facility]  [phone]  Telephone [phone]  Fax [phone]          [1]   Current Outpatient Medications   Medication Sig Dispense Refill    albuterol  HFA 90 mcg/actuation inhaler Inhale 2-4 puffs every six (6) hours as needed for wheezing. 18 g 5    clindamycin (CLEOCIN T) 1 % lotion APLPY THIN LAYER TO AFFECTED AREAS TWICE DAILY ON ACTIVE PUSTULES      dextroamphetamine -amphetamine  (ADDERALL) 20 mg tablet Take 1.5 tablets (30 mg total) by mouth daily. 45 tablet 0    EPINEPHrine  (EPIPEN ) 0.3 mg/0.3 mL injection Inject 0.3 mL (0.3 mg total) into the muscle every five (5) minutes as needed for anaphylaxis. 2 each 3    ibuprofen (MOTRIN) 800 MG tablet Take 1 tablet (800 mg total) by mouth Three (3) times a day.      inhalational spacing device Spcr 1 each by Miscellaneous route Take as directed. 1 each 0    omalizumab  (XOLAIR ) 150 mg/mL syringe Inject the contents of 2 syringes (300 mg total) under the skin every fourteen (14) days. 4 mL 12    tirzepatide , weight loss, 2.5 mg/0.5 mL Soln Inject 2.5 mg under the skin every seven (7) days. 2 mL 3    triamcinolone  (KENALOG ) 0.1 % cream Apply topically two (2) times a day. 453.6 g 2    cromolyn  (GASTROCROM ) 100 mg/5 mL solution Take 10 mL (200 mg total) by mouth Four (4) times a day (before meals and nightly). (Patient not taking: Reported on 08/29/2023) 1200 mL 11    cyanocobalamin , vitamin B-12, 1,000 mcg/mL injection Inject 1 mL (1,000 mcg total) under the skin every thirty (30) days. 3 mL 3 scopolamine  (TRANSDERM-SCOP) 1 mg over 3 days Place 1 patch (1 mg total) on the skin every third day. (Patient not taking: Reported on 08/29/2023) 4 patch 1     No current facility-administered medications for this visit.   [2]   Past Medical History:  Diagnosis Date    ADD (attention deficit disorder) without hyperactivity     GERD (gastroesophageal reflux disease)     Mast cell disease    [3]   Past Surgical History:  Procedure Laterality Date    ADENOIDECTOMY      CESAREAN SECTION,  CLASSIC      HYSTERECTOMY      partial    LIVER BIOPSY      PR BIOPSY SOFT TISSUE NECK/CHEST Right 02/15/2021    Procedure: BIOPSY, SOFT TISSUE OF NECK OR THORAX;  Surgeon: Silver Elnor Daisy, MD;  Location: MAIN OR [facility];  Service: ENT    SINUS SURGERY      THYROID SURGERY      biopsy    TONSILLECTOMY     [4]   Social History  Socioeconomic History    Marital status: Married     Spouse name: None    Number of children: None    Years of education: None    Highest education level: None   Tobacco Use    Smoking status: Never    Smokeless tobacco: Never   Vaping Use    Vaping status: Never Used   Substance and Sexual Activity    Alcohol use: Not Currently    Drug use: Never     Social Drivers of Health     Food Insecurity: No Food Insecurity (08/29/2023)    Hunger Vital Sign     Worried About Running Out of Food in the Last Year: Never true     Ran Out of Food in the Last Year: Never true   Transportation Needs: No Transportation Needs (08/29/2023)    PRAPARE - Therapist, art (Medical): No     Lack of Transportation (Non-Medical): No   Housing: Low Risk  (08/29/2023)    Housing     Within the past 12 months, have you ever stayed: outside, in a car, in a tent, in an overnight shelter, or temporarily in someone else's home (i.e. couch-surfing)?: No     Are you worried about losing your housing?: No

## 2023-08-30 MED ORDER — TIRZEPATIDE (WEIGHT LOSS) 10 MG/0.5 ML SUBCUTANEOUS SOLUTION
SUBCUTANEOUS | 11 refills | 0.00000 days | Status: CP
Start: 2023-08-30 — End: ?

## 2023-09-12 DIAGNOSIS — F988 Other specified behavioral and emotional disorders with onset usually occurring in childhood and adolescence: Principal | ICD-10-CM

## 2023-09-12 MED ORDER — DEXTROAMPHETAMINE-AMPHETAMINE 20 MG TABLET
ORAL_TABLET | Freq: Every day | ORAL | 0 refills | 30.00000 days
Start: 2023-09-12 — End: 2024-09-11

## 2023-09-13 NOTE — Unmapped (Signed)
 Patient is requesting the following refill  Requested Prescriptions     Pending Prescriptions Disp Refills    dextroamphetamine -amphetamine  (ADDERALL) 20 mg tablet 45 tablet 0     Sig: Take 1.5 tablets (30 mg total) by mouth daily.       Recent Visits  Date Type Provider Dept   08/29/23 Office Visit Gretta Coolidge Grebe, FNP [place] [facility]   05/23/23 Office Visit Gretta Coolidge Grebe, [place] [facility]   02/21/23 Office Visit Gretta Coolidge Grebe, [place] [facility]   11/22/22 Office Visit Gretta Coolidge Grebe, [place] [facility]   Showing recent visits within past 365 days and meeting all other requirements  Future Appointments  Date Type Provider Dept   11/29/23 Appointment Gretta Coolidge Grebe, FNP [place] [facility]   Showing future appointments within next 365 days and meeting all other requirements       Labs: Not applicable this refill

## 2023-09-19 MED ORDER — DEXTROAMPHETAMINE-AMPHETAMINE 20 MG TABLET
ORAL_TABLET | Freq: Every day | ORAL | 0 refills | 30.00000 days | Status: CP
Start: 2023-09-19 — End: 2024-09-18

## 2023-10-15 DIAGNOSIS — F988 Other specified behavioral and emotional disorders with onset usually occurring in childhood and adolescence: Principal | ICD-10-CM

## 2023-10-15 MED ORDER — DEXTROAMPHETAMINE-AMPHETAMINE 20 MG TABLET
ORAL_TABLET | Freq: Every day | ORAL | 0 refills | 30.00000 days | Status: CP
Start: 2023-10-15 — End: 2024-10-14

## 2023-10-15 NOTE — Unmapped (Signed)
 Patient is requesting the following refill  Requested Prescriptions     Pending Prescriptions Disp Refills    dextroamphetamine -amphetamine  (ADDERALL) 20 mg tablet 45 tablet 0     Sig: Take 1.5 tablets (30 mg total) by mouth daily.       Last refill given on: 09/13/2023 with 45 count and 0 refills.     Recent Visits  Date Type Provider Dept   08/29/23 Office Visit Gretta Coolidge Grebe, FNP [place] [facility]   05/23/23 Office Visit Gretta Coolidge Grebe, [place] [facility]   02/21/23 Office Visit Gretta Coolidge Grebe, [place] [facility]   11/22/22 Office Visit Gretta Coolidge Grebe, [place] [facility]   Showing recent visits within past 365 days and meeting all other requirements  Future Appointments  Date Type Provider Dept   11/29/23 Appointment Gretta Coolidge Grebe, FNP [place] [facility]   Showing future appointments within next 365 days and meeting all other requirements       Opioid Monitoring   Urine Tox Screen Last Drug Screen Date: Not Found  Opiate Confirmation Test Last Drug Screen Date: Not Found  Last Opioid Dispensed Provider: Tinnie Maffucci, MD  Prescribed MEDD : 0  Last PDMP Review: 09/13/2023  9:19 AM  Last Opioid Pain Agreement Signed Date: Not Found  Last Non Opioid Controlled Substance Pain Agreement Signed Date: Not Found    Naloxone Ordered: Not Found  Last OV: 08/29/2023

## 2023-10-24 MED ORDER — TIRZEPATIDE (WEIGHT LOSS) 15 MG/0.5 ML SUBCUTANEOUS SOLUTION
SUBCUTANEOUS | 6 refills | 0.00000 days | Status: CP
Start: 2023-10-24 — End: ?

## 2023-11-01 ENCOUNTER — Ambulatory Visit: Admit: 2023-11-01 | Discharge: 2023-11-02

## 2023-11-06 LAB — TB MITOGEN: TB MITOGEN VALUE: 10

## 2023-11-06 LAB — QUANTIFERON TB GOLD PLUS
QUANTIFERON ANTIGEN 1 MINUS NIL: 0.02 [IU]/mL
QUANTIFERON ANTIGEN 2 MINUS NIL: 0.01 [IU]/mL
QUANTIFERON MITOGEN: 9.96 [IU]/mL
QUANTIFERON TB GOLD PLUS: NEGATIVE
QUANTIFERON TB NIL VALUE: 0.04 [IU]/mL

## 2023-11-06 LAB — TB AG1: TB AG1 VALUE: 0.06

## 2023-11-06 LAB — TB NIL: TB NIL VALUE: 0.04

## 2023-11-06 LAB — TB AG2: TB AG2 VALUE: 0.05

## 2023-11-14 DIAGNOSIS — F988 Other specified behavioral and emotional disorders with onset usually occurring in childhood and adolescence: Principal | ICD-10-CM

## 2023-11-14 MED ORDER — DEXTROAMPHETAMINE-AMPHETAMINE 20 MG TABLET
ORAL_TABLET | Freq: Every day | ORAL | 0 refills | 30.00000 days | Status: CP
Start: 2023-11-14 — End: 2024-11-13

## 2023-11-14 NOTE — Unmapped (Signed)
 Patient is requesting the following refill  Requested Prescriptions     Pending Prescriptions Disp Refills    dextroamphetamine -amphetamine  (ADDERALL) 20 mg tablet 45 tablet 0     Sig: Take 1.5 tablets (30 mg total) by mouth daily.       Last refill given on: 10/15/2023 with 45 count and 0 refills.     Recent Visits  Date Type Provider Dept   08/29/23 Office Visit Gretta Coolidge Grebe, FNP [place] [facility]   05/23/23 Office Visit Gretta Coolidge Grebe, [place] [facility]   02/21/23 Office Visit Gretta Coolidge Grebe, [place] [facility]   11/22/22 Office Visit Gretta Coolidge Grebe, [place] [facility]   Showing recent visits within past 365 days and meeting all other requirements  Future Appointments  Date Type Provider Dept   12/13/23 Appointment Gretta Coolidge Grebe, FNP [place] [facility]   Showing future appointments within next 365 days and meeting all other requirements       Opioid Monitoring   Urine Tox Screen Last Drug Screen Date: Not Found  Opiate Confirmation Test Last Drug Screen Date: Not Found  Last Opioid Dispensed Provider: Tinnie Maffucci, MD  Prescribed MEDD : 0  Last PDMP Review: 10/15/2023  5:11 PM  Last Opioid Pain Agreement Signed Date: Not Found  Last Non Opioid Controlled Substance Pain Agreement Signed Date: Not Found    Naloxone Ordered: Not Found  Last OV: 08/29/2023

## 2023-12-07 ENCOUNTER — Encounter: Admit: 2023-12-07 | Discharge: 2023-12-07 | Payer: BLUE CROSS/BLUE SHIELD | Attending: Family | Primary: Family

## 2023-12-07 DIAGNOSIS — F900 Attention-deficit hyperactivity disorder, predominantly inattentive type: Principal | ICD-10-CM

## 2023-12-07 DIAGNOSIS — E663 Overweight: Principal | ICD-10-CM

## 2023-12-07 DIAGNOSIS — G8929 Other chronic pain: Principal | ICD-10-CM

## 2023-12-07 DIAGNOSIS — R8290 Unspecified abnormal findings in urine: Principal | ICD-10-CM

## 2023-12-07 DIAGNOSIS — M25561 Pain in right knee: Principal | ICD-10-CM

## 2023-12-07 DIAGNOSIS — N898 Other specified noninflammatory disorders of vagina: Principal | ICD-10-CM

## 2023-12-07 LAB — COMPREHENSIVE METABOLIC PANEL
ALBUMIN: 4.3 g/dL (ref 3.4–5.0)
ALKALINE PHOSPHATASE: 68 U/L (ref 38–126)
ALT (SGPT): 26 U/L (ref <35)
ANION GAP: 9 mmol/L (ref 5–14)
AST (SGOT): 26 U/L (ref 14–38)
BILIRUBIN TOTAL: 2.1 mg/dL — ABNORMAL HIGH (ref 0.1–1.2)
BLOOD UREA NITROGEN: 13 mg/dL (ref 7–21)
BUN / CREAT RATIO: 16
CALCIUM: 9.5 mg/dL (ref 8.5–10.2)
CHLORIDE: 102 mmol/L (ref 98–107)
CO2: 26 mmol/L (ref 22.0–32.0)
CREATININE: 0.8 mg/dL (ref 0.60–1.00)
EGFR CKD-EPI (2021) FEMALE: >90 mL/min/1.73m2 (ref >=60)
GLUCOSE RANDOM: 96 mg/dL (ref 74–106)
POTASSIUM: 3.7 mmol/L (ref 3.5–5.0)
PROTEIN TOTAL: 6.9 g/dL (ref 6.5–8.3)
SODIUM: 137 mmol/L (ref 135–145)

## 2023-12-07 LAB — CBC W/ AUTO DIFF
BASOPHILS ABSOLUTE COUNT: 0 10*9/L (ref 0.0–0.1)
BASOPHILS RELATIVE PERCENT: 0.7 %
EOSINOPHILS ABSOLUTE COUNT: 0.1 10*9/L (ref 0.0–0.5)
EOSINOPHILS RELATIVE PERCENT: 1.4 %
HEMATOCRIT: 37.4 % (ref 34.0–44.0)
HEMOGLOBIN: 13.3 g/dL (ref 11.3–14.9)
LYMPHOCYTES ABSOLUTE COUNT: 2 10*9/L (ref 1.1–3.6)
LYMPHOCYTES RELATIVE PERCENT: 32.2 %
MEAN CORPUSCULAR HEMOGLOBIN CONC: 35.4 g/dL (ref 32.0–36.0)
MEAN CORPUSCULAR HEMOGLOBIN: 31 pg (ref 25.9–32.4)
MEAN CORPUSCULAR VOLUME: 87.4 fL (ref 77.6–95.7)
MEAN PLATELET VOLUME: 8.1 fL (ref 6.8–10.7)
MONOCYTES ABSOLUTE COUNT: 0.5 10*9/L (ref 0.3–0.8)
MONOCYTES RELATIVE PERCENT: 7.6 %
NEUTROPHILS ABSOLUTE COUNT: 3.7 10*9/L (ref 1.8–7.8)
NEUTROPHILS RELATIVE PERCENT: 58.1 %
NUCLEATED RED BLOOD CELLS: 0 /100{WBCs} (ref <=4)
PLATELET COUNT: 248 10*9/L (ref 150–450)
RED BLOOD CELL COUNT: 4.28 10*12/L (ref 3.95–5.13)
RED CELL DISTRIBUTION WIDTH: 12.9 % (ref 12.2–15.2)
WBC ADJUSTED: 6.3 10*9/L (ref 3.6–11.2)

## 2023-12-07 NOTE — Unmapped (Signed)
 [facility]     Assessment/Plan       Encounter Diagnoses   Name Primary?    Malodorous urine Yes    Vaginal odor     Overweight     Attention deficit hyperactivity disorder (ADHD), predominantly inattentive type     Chronic pain of right knee      Assessment & Plan  Urinary tract infection, unspecified  Strong-smelling urine with positive nitrites suggests possible UTI. Leukocytes negative, atypical for UTI. No dysuria or burning sensation, but persistent unusual urine odor.  - Send urine culture to confirm infection.  - If culture positive, prescribe Macrobid.    Overweight  Weight loss of 30 pounds. Current weight 155-156 pounds. On tirzepatide  regimen, bought directly from [employer]  .  - Continue current tirzepatide  regimen.  - Monitor weight and adjust dosage as needed.    ADHD  Treating ADHD with adderall with effective results.   Has a  few Adderall pills left, will notify when refill needed.  - Notify when Adderall refill is needed.    Chronic knee pain  Patient with improved knee pain since she has lost weight.       Patient response, barriers, and adherence to medication have been identified and addressed. Barriers to the treatment plan have been identified and addressed with patient. Unhealthy behaviors have been noted and addressed as appropriate. Patient voiced understanding and all questions have been answered to satisfaction. Return in about 3 months (around 03/08/2024) for adhd.    ORDERS THIS VISIT     Orders Placed This Encounter   Procedures    Urine culture    CBC w/ Differential    Comprehensive metabolic panel    POCT Urinalysis Automated      New Prescriptions    No medications on file      Modified Medications    No medications on file      Discontinued Medications    No medications on file      Subjective:   Time seen  December 07, 2023 3:12 PM    ERE:Rojmx, Coolidge Grebe, FNP    Vanessa Hale, is a 38 y.o. female    HPI:  History of Present Illness  Vanessa Hale is a 38 year old female presenting for routine three month follow up    She has noticed a strong, unusual odor in her urine for some time, initially attributing it to dehydration. Her partner also noticed the odor, prompting her to seek medical advice. She describes the odor as 'funky' and unlike anything she has experienced before. No burning sensation during urination, which she associates with past urinary tract infections, the last of which occurred at age 6. She has not attempted any treatment for this issue yet.    She has experienced significant weight loss of approximately 30 pounds, with her current weight around 155-156 pounds. She is on a tirzepatide  regimen,     She is not currently in need of a refill for her Adderall as she still has some pills left. She plans to send a message when she requires more.    She has discontinued Xolair  and has been in communication with her allergy doctor, with a follow-up appointment scheduled for November. Her allergies have improved, and her knees have been fine without any issues.    (Verbal consent obtained prior to using the Ambient solution)    The following portions of the patient's history were reviewed and updated  as appropriate: allergies, current medications, past family history, past medical history, past social history, past surgical history and problem list.    ROS:   A 12 point review of systems was conducted with negative results except as noted in the HPI.     Objective:     Physical Exam  BP 110/78 (BP Site: L Arm, BP Position: Sitting, BP Cuff Size: X-Large)  - Pulse 110  - Temp 36.8 ??C (98.3 ??F) (Temporal)  - Wt 72.1 kg (159 lb)  - SpO2 98%  - BMI 27.94 kg/m??     General-  Normal appearing thin female in no apparent distress.  Neurologic- Alert and oriented X3.  Cranial nerve II-XII grossly intact. No focal abnormality.  HEENT-  Normocephalic atraumatic head.  No scleral icterus.    Neck- Supple, JVD normal.,   Respiratory- respirations even and unlabored,  Lungs clear to auscultation a/p, no wheezes, rhonchi, or rales.  Cardiovascular  heart rhythm regular. Murmur not present.  Gastrointestinal- Soft, .  MSK-  No deformity, No clubbing or cyanosis. No edema, pulses intact   Psych- Normal mood, appropriate.  Integumentary - skin warm, dry, and intact  Current Medications[1]    Past Medical History[2]    Past Surgical History[3]    Social History[4]      [facility]  [phone]  Telephone [phone]  Fax [phone]          [1]   Current Outpatient Medications   Medication Sig Dispense Refill    albuterol  HFA 90 mcg/actuation inhaler Inhale 2-4 puffs every six (6) hours as needed for wheezing. 18 g 5    cyanocobalamin , vitamin B-12, 1,000 mcg/mL injection Inject 1 mL (1,000 mcg total) under the skin every thirty (30) days. 3 mL 3    dextroamphetamine -amphetamine  (ADDERALL) 20 mg tablet Take 1.5 tablets (30 mg total) by mouth daily. 45 tablet 0    EPINEPHrine  (EPIPEN ) 0.3 mg/0.3 mL injection Inject 0.3 mL (0.3 mg total) into the muscle every five (5) minutes as needed for anaphylaxis. 2 each 3    ibuprofen (MOTRIN) 800 MG tablet Take 1 tablet (800 mg total) by mouth Three (3) times a day.      tirzepatide , weight loss, 15 mg/0.5 mL Soln Inject 15 mg under the skin every seven (7) days. 2 mL 6    clindamycin (CLEOCIN T) 1 % lotion APLPY THIN LAYER TO AFFECTED AREAS TWICE DAILY ON ACTIVE PUSTULES      cromolyn  (GASTROCROM ) 100 mg/5 mL solution Take 10 mL (200 mg total) by mouth Four (4) times a day (before meals and nightly). (Patient not taking: Reported on 12/07/2023) 1200 mL 11    inhalational spacing device Spcr 1 each by Miscellaneous route Take as directed. 1 each 0    omalizumab  (XOLAIR ) 150 mg/mL syringe Inject the contents of 2 syringes (300 mg total) under the skin every fourteen (14) days. (Patient not taking: Reported on 12/07/2023) 4 mL 12    scopolamine  (TRANSDERM-SCOP) 1 mg over 3 days Place 1 patch (1 mg total) on the skin every third day. (Patient not taking: Reported on 12/07/2023) 4 patch 1    triamcinolone  (KENALOG ) 0.1 % cream Apply topically two (2) times a day. 453.6 g 2     No current facility-administered medications for this visit.   [2]   Past Medical History:  Diagnosis Date    ADD (attention deficit disorder) without hyperactivity     GERD (gastroesophageal reflux disease)  Mast cell disease    [3]   Past Surgical History:  Procedure Laterality Date    ADENOIDECTOMY      CESAREAN SECTION, CLASSIC      HYSTERECTOMY      partial    LIVER BIOPSY      PR BIOPSY SOFT TISSUE NECK/CHEST Right 02/15/2021    Procedure: BIOPSY, SOFT TISSUE OF NECK OR THORAX;  Surgeon: Silver Elnor Daisy, MD;  Location: MAIN OR [place];  Service: ENT    SINUS SURGERY      THYROID SURGERY      biopsy    TONSILLECTOMY     [4]   Social History  Socioeconomic History    Marital status: Married     Spouse name: None    Number of children: None    Years of education: None    Highest education level: None   Tobacco Use    Smoking status: Never    Smokeless tobacco: Never   Vaping Use    Vaping status: Never Used   Substance and Sexual Activity    Alcohol use: Not Currently    Drug use: Never     Social Drivers of Health     Food Insecurity: No Food Insecurity (08/29/2023)    Hunger Vital Sign     Worried About Running Out of Food in the Last Year: Never true     Ran Out of Food in the Last Year: Never true   Transportation Needs: No Transportation Needs (08/29/2023)    PRAPARE - Therapist, art (Medical): No     Lack of Transportation (Non-Medical): No   Housing: Low Risk  (08/29/2023)    Housing     Within the past 12 months, have you ever stayed: outside, in a car, in a tent, in an overnight shelter, or temporarily in someone else's home (i.e. couch-surfing)?: No     Are you worried about losing your housing?: No

## 2023-12-11 DIAGNOSIS — N3000 Acute cystitis without hematuria: Principal | ICD-10-CM

## 2023-12-11 MED ORDER — NITROFURANTOIN MONOHYDRATE/MACROCRYSTALS 100 MG CAPSULE
ORAL_CAPSULE | Freq: Two times a day (BID) | ORAL | 0 refills | 7.00000 days | Status: CP
Start: 2023-12-11 — End: 2023-12-18

## 2023-12-17 DIAGNOSIS — F988 Other specified behavioral and emotional disorders with onset usually occurring in childhood and adolescence: Principal | ICD-10-CM

## 2023-12-17 MED ORDER — DEXTROAMPHETAMINE-AMPHETAMINE 20 MG TABLET
ORAL_TABLET | Freq: Every day | ORAL | 0 refills | 30.00000 days | Status: CP
Start: 2023-12-17 — End: 2024-12-16

## 2023-12-17 NOTE — Unmapped (Signed)
 Patient is requesting the following refill  Requested Prescriptions     Pending Prescriptions Disp Refills    dextroamphetamine -amphetamine  (ADDERALL) 20 mg tablet 45 tablet 0     Sig: Take 1.5 tablets (30 mg total) by mouth daily.       Last refill given on: 11/14/2023 with 45 count and 0 refills.     Recent Visits  Date Type Provider Dept   12/07/23 Office Visit Gretta Coolidge Grebe, FNP [facility]   08/29/23 Office Visit Gretta Coolidge Grebe, [place] [facility]   05/23/23 Office Visit Gretta Coolidge Grebe, [place] [facility]   02/21/23 Office Visit Gretta Coolidge Grebe, [place] [facility]   Showing recent visits within past 365 days and meeting all other requirements  Future Appointments  Date Type Provider Dept   03/14/24 Appointment Gretta Coolidge Grebe, FNP [facility]   Showing future appointments within next 365 days and meeting all other requirements       Opioid Monitoring   Urine Tox Screen Last Drug Screen Date: Not Found  Opiate Confirmation Test Last Drug Screen Date: Not Found  Last Opioid Dispensed Provider: Tinnie Maffucci, MD  Prescribed MEDD : 0  Last PDMP Review: 12/07/2023  3:16 PM  Last Opioid Pain Agreement Signed Date: Not Found  Last Non Opioid Controlled Substance Pain Agreement Signed Date: Not Found    Naloxone Ordered: Not Found  Last OV: 12/07/2023

## 2024-01-24 DIAGNOSIS — F988 Other specified behavioral and emotional disorders with onset usually occurring in childhood and adolescence: Principal | ICD-10-CM

## 2024-01-24 MED ORDER — DEXTROAMPHETAMINE-AMPHETAMINE 20 MG TABLET
ORAL_TABLET | Freq: Every day | ORAL | 0 refills | 30.00000 days | Status: CP
Start: 2024-01-24 — End: 2025-01-23

## 2024-02-21 NOTE — Progress Notes (Signed)
 Specialty Medication(s): Xolair     Ms.Olthoff has been dis-enrolled from the [facility] and [facility] specialty pharmacy services as a result of multiple unsuccessful outreach attempts by the pharmacy.    Additional information provided to the patient: n/a    Shelba DELENA Hummer, PharmD  [facility] Specialty Pharmacist

## 2024-02-29 DIAGNOSIS — F988 Other specified behavioral and emotional disorders with onset usually occurring in childhood and adolescence: Principal | ICD-10-CM

## 2024-02-29 MED ORDER — DEXTROAMPHETAMINE-AMPHETAMINE 20 MG TABLET
ORAL_TABLET | Freq: Every day | ORAL | 0 refills | 30.00000 days | Status: CP
Start: 2024-02-29 — End: 2025-02-28

## 2024-04-01 DIAGNOSIS — F988 Other specified behavioral and emotional disorders with onset usually occurring in childhood and adolescence: Principal | ICD-10-CM

## 2024-04-01 MED ORDER — DEXTROAMPHETAMINE-AMPHETAMINE 20 MG TABLET
ORAL_TABLET | Freq: Every day | ORAL | 0 refills | 30.00000 days | Status: CP
Start: 2024-04-01 — End: 2025-04-01
